# Patient Record
Sex: Male | Born: 1975 | Hispanic: Yes | Marital: Married | Smoking: Never smoker
Health system: Southern US, Community
[De-identification: ages and names within clinical notes are randomized; demographics above are authoritative.]

---

## 2005-08-06 ENCOUNTER — Emergency Department (HOSPITAL_COMMUNITY): Admission: EM | Admit: 2005-08-06 | Discharge: 2005-08-06 | Payer: Self-pay | Admitting: *Deleted

## 2007-04-14 ENCOUNTER — Ambulatory Visit (HOSPITAL_COMMUNITY): Admission: RE | Admit: 2007-04-14 | Discharge: 2007-04-14 | Payer: Self-pay | Admitting: Chiropractic Medicine

## 2008-11-28 ENCOUNTER — Emergency Department (HOSPITAL_COMMUNITY): Admission: EM | Admit: 2008-11-28 | Discharge: 2008-11-28 | Payer: Self-pay | Admitting: Emergency Medicine

## 2010-11-29 ENCOUNTER — Encounter: Payer: Self-pay | Admitting: Chiropractic Medicine

## 2015-03-21 ENCOUNTER — Emergency Department (HOSPITAL_COMMUNITY)
Admission: EM | Admit: 2015-03-21 | Discharge: 2015-03-22 | Disposition: A | Discharge status: Home / Self Care | Payer: Self-pay | Attending: Emergency Medicine | Admitting: Emergency Medicine

## 2015-03-21 ENCOUNTER — Emergency Department (HOSPITAL_COMMUNITY): Payer: Self-pay

## 2015-03-21 ENCOUNTER — Encounter (HOSPITAL_COMMUNITY): Payer: Self-pay | Admitting: *Deleted

## 2015-03-21 DIAGNOSIS — Z23 Encounter for immunization: Secondary | ICD-10-CM | POA: Insufficient documentation

## 2015-03-21 DIAGNOSIS — Y998 Other external cause status: Secondary | ICD-10-CM | POA: Insufficient documentation

## 2015-03-21 DIAGNOSIS — S4991XA Unspecified injury of right shoulder and upper arm, initial encounter: Secondary | ICD-10-CM | POA: Insufficient documentation

## 2015-03-21 DIAGNOSIS — S62609A Fracture of unspecified phalanx of unspecified finger, initial encounter for closed fracture: Secondary | ICD-10-CM

## 2015-03-21 DIAGNOSIS — Y9289 Other specified places as the place of occurrence of the external cause: Secondary | ICD-10-CM | POA: Insufficient documentation

## 2015-03-21 DIAGNOSIS — S62623A Displaced fracture of medial phalanx of left middle finger, initial encounter for closed fracture: Secondary | ICD-10-CM | POA: Insufficient documentation

## 2015-03-21 DIAGNOSIS — Y9389 Activity, other specified: Secondary | ICD-10-CM | POA: Insufficient documentation
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
## 2015-03-22 ENCOUNTER — Encounter (HOSPITAL_COMMUNITY): Payer: Self-pay | Admitting: Emergency Medicine

## 2015-03-22 ENCOUNTER — Emergency Department (HOSPITAL_COMMUNITY): Payer: Self-pay

## 2015-03-22 MED ORDER — KETOROLAC TROMETHAMINE 60 MG/2ML IM SOLN
60.0000 mg | Freq: Once | INTRAMUSCULAR | Status: AC
  Administered 2015-03-22: 60 mg via INTRAMUSCULAR
  Filled 2015-03-22: qty 2

## 2015-03-22 MED ORDER — TETANUS-DIPHTH-ACELL PERTUSSIS 5-2.5-18.5 LF-MCG/0.5 IM SUSP
0.5000 mL | Freq: Once | INTRAMUSCULAR | Status: AC
  Administered 2015-03-22: 0.5 mL via INTRAMUSCULAR
  Filled 2015-03-22: qty 0.5

## 2015-03-22 MED ORDER — NAPROXEN 375 MG PO TABS: 375.0000 mg | ORAL_TABLET | Freq: Two times a day (BID) | ORAL | Status: DC
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
## 2016-03-17 ENCOUNTER — Ambulatory Visit (INDEPENDENT_AMBULATORY_CARE_PROVIDER_SITE_OTHER): Payer: Self-pay | Admitting: Family Medicine

## 2016-03-17 VITALS — BP 124/86 | HR 68 | Temp 98.6°F | Resp 16 | Wt 186.0 lb

## 2016-03-17 DIAGNOSIS — L409 Psoriasis, unspecified: Secondary | ICD-10-CM

## 2016-03-17 MED ORDER — CLOBETASOL PROPIONATE 0.05 % EX CREA: 1.0000 "application " | TOPICAL_CREAM | Freq: Two times a day (BID) | CUTANEOUS | Status: DC

## 2016-03-17 NOTE — Patient Instructions (Addendum)
Psoriasis (Psoriasis) La psoriasis es una afeccin prolongada (crnica) de inflamacin en la piel. Se produce debido a que el sistema inmunitario hace que se formen clulas cutneas con mucha rapidez. En consecuencia, se desarrollan demasiadas clulas cutneas que forman manchas (placas) rojas y con relieve en la piel, que se ven plateadas. Las placas pueden aparecer en cualquier parte del cuerpo. Pueden tener cualquier tamao o forma. La psoriasis puede ir y venir. Puede ser de leve a muy grave. No puede transmitirse de [place] persona a otra (no es contagiosa).  CAUSAS  Se desconoce la causa de la psoriasis, pero determinados factores pueden empeorar la afeccin. Estos incluyen los siguientes:   Lesiones o traumatismos en la piel, como cortes, raspones, quemaduras de sol y sequedad.  Falta de sol.  Algunos medicamentos.  Alcohol.  Consumo de tabaco.  El estrs.  Infecciones causadas por bacterias o virus. FACTORES DE RIESGO Es ms probable que esta afeccin se manifieste en:  Las personas con antecedentes familiares de psoriasis.  Las Dealerpersonas de origen caucsico.  Las personas que tienen de 15 a 30aos y de 50 a 60aos. SNTOMAS  Existen cinco tipos diferentes de psoriasis. Se puede presentar ms de un tipo de psoriasis a lo largo de la vida. Estos tipos son los siguientes:   En placas.  En gotas.  Seborreica.  Pustulosa.  Eritrodrmica. Cada tipo de psoriasis presenta sntomas diferentes.   Uno de los sntomas de la psoriasis en placas es la presencia de Wilburtonmachas rojas y con relieve, con una cubierta blanca plateada (escama). Estas placas pueden causar picazn. Las uas pueden estar frgiles y Panamaquebradizas, o caerse.  Uno de los sntomas de la psoriasis en gotas es la presencia de manchas rojas pequeas que a menudo aparecen en el tronco, los brazos y las piernas. Estas manchas pueden desarrollarse despus de una enfermedad, en especial, faringitis estreptoccica.  Uno  de los sntomas de la psoriasis seborreica es la presencia de placas debajo de las axilas o los senos, o en los genitales, la ingle o las nalgas.  Uno de los sntomas de la psoriasis pustulosa es la presencia de los bultos llenos de pus (dolorosos, rojos e hinchados) en las palmas de las manos o las plantas de los pies. Tambin puede sentirse exhausto, afiebrado, dbil o sin apetito.  Uno de los sntomas de la psoriasis eritrodrmica es el aspecto rojo brillante en la piel, que puede parecer Cave Springsquemada. Puede sentir los latidos cardacos acelerados y Press photographertener la temperatura corporal muy alta o muy baja. Quizs sienta picazn o dolor. DIAGNSTICO  El mdico puede sospechar la presencia de psoriasis en funcin de los sntomas y los antecedentes familiares. El mdico Location managertambin le realizar un examen fsico. Esto puede incluir un procedimiento para extraer y examinar Lauris Poaguna muestra de tejido (biopsia). Tambin pueden derivarlo a un mdico especialista en enfermedades de la piel (dermatlogo).  TRATAMIENTO Esta afeccin no tiene [place]ub[place], pero el tratamiento puede ayudar a Theatre managercontrolarla. Entre los PepsiCoobjetivos del Salladasburgtratamiento, se incluye lo siguiente:   Ayudar a que la piel cicatrice.  Reducir la picazn y la inflamacin.  Retrasar el desarrollo de nuevas clulas cutneas.  Ayudar a que el sistema inmunitario responda mejor y no deteriore la piel. El tratamiento vara, segn la gravedad de la afeccin. El tratamiento puede incluir lo siguiente:   Cremas o ungentos.  Exposicin a rayos ultravioleta (fototerapia). Esto puede incluir la luz natural del sol o la fototerapia en el consultorio mdico.  Medicamentos (tratamiento sistmico). Estos medicamentos pueden ayudar  a que el cuerpo controle mejor el desarrollo de las clulas cutneas y la inflamacin. Pueden utilizarse junto con fototerapia o ungentos. Si tiene una infeccin, tambin puede recibir antibiticos. INSTRUCCIONES PARA EL CUIDADO EN EL HOGAR Cuidado de la  piel  Humctese la piel segn sea necesario. Use solamente las cremas humectantes aprobadas por su mdico.   Aplique compresas fras en las zonas afectadas.   No se rasque la piel.  Estilo de vida  No consuma productos que contengan tabaco. Estos incluyen cigarrillos, tabaco para mascar y cigarrillos electrnicos. Si necesita ayuda para dejar de fumar, consulte al mdico.  No tome alcohol, o tome poca cantidad.   Pruebe con tcnicas que reduzcan el estrs, como meditacin o yoga.  Expngase al sol como se lo haya indicado el mdico. No se broncee.   Considere participar en un grupo de apoyo para la psoriasis.  Medicamentos  Tome o use los medicamentos de venta libre y los recetados solamente como se lo haya indicado el mdico.  Si le recetaron un antibitico, tmelo o selo como se lo haya indicado el mdico. No deje de tomar el antibitico aunque la afeccin mejore. Instrucciones generales  Lleve un diario como ayuda para rastrear lo que le desencadena un brote. Intente evitar los factores desencadenantes.   Consulte a un consejero o un asistente social si se siente triste, frustrado o desesperanzado con respecto a su afeccin ya que considera que esta interfiere en su trabajo y sus relaciones.  Concurra a todas las visitas de control como se lo haya indicado el mdico. Esto es importante. SOLICITE ATENCIN MDICA SI:  El dolor empeora.  Aumentan el enrojecimiento y el calor en la zona de la herida.   Comienza a tener dolor o entumecimiento en las articulaciones, o estos sntomas empeoran.  Las uas comienzan a romperse fcilmente o a desprenderse del lecho ungueal.   Tiene fiebre.   Se siente deprimido.   Esta informacin no tiene como fin reemplazar el consejo del mdico. Asegrese de hacerle al mdico cualquier pregunta que tenga.   Document Released: 08/04/2005 Document Revised: 07/16/2015 Elsevier Interactive Patient Education 2016 Elsevier  Inc.  

## 2016-03-17 NOTE — Progress Notes (Signed)
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 It does run in his family. The main areas of psoriasis on his anterior shins where he's chafed it on the ladder.  Patient has used triamcinolone in the past but has not worked.  Objective::BP 124/86 mmHg  Pulse 68  Temp(Src) 98.6 F (37 C) (Oral)  Resp 16  Wt 186 lb (84.369 kg)  SpO2 96% Patient has silver plaque overlying abrasions on his anterior shins.  Assessment: Psoriasis, chronic and limited to his lower extremities  Plan:Psoriasis - Plan: clobetasol cream (TEMOVATE) 0.05 %  Signed, Elvina SidleKurt Talea Manges M.D.

## 2017-03-31 ENCOUNTER — Ambulatory Visit (INDEPENDENT_AMBULATORY_CARE_PROVIDER_SITE_OTHER): Payer: Self-pay | Admitting: Emergency Medicine

## 2017-03-31 ENCOUNTER — Encounter: Payer: Self-pay | Admitting: Emergency Medicine

## 2017-03-31 VITALS — BP 117/78 | HR 58 | Temp 98.0°F | Resp 18 | Ht 65.0 in | Wt 181.4 lb

## 2017-03-31 DIAGNOSIS — S0502XA Injury of conjunctiva and corneal abrasion without foreign body, left eye, initial encounter: Secondary | ICD-10-CM | POA: Insufficient documentation

## 2017-03-31 MED ORDER — TOBRAMYCIN 0.3 % OP SOLN: 2.0000 [drp] | OPHTHALMIC | 1 refills | Status: DC
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
## 2017-03-31 NOTE — Patient Instructions (Addendum)
     IF you received an x-ray today, you will [facility] an [facility]. Please contact [facility] at [phone] with questions or concerns regarding your invoice.   IF you received labwork today, you will receive an invoice from [facility]. Please contact [facility] at [phone] with questions or concerns regarding your invoice.   Our billing staff will not be able to assist you with questions regarding bills from these companies.  You will be contacted with the lab results as soon as they are available. The fastest way to get your results is to activate your My Chart account. Instructions are located on the last page of this paperwork. If you have not heard from us regarding the results in 2 weeks, please contact this office.     Abrasin corneal (Corneal Abrasion) La crnea es la cubierta transparente en la parte anterior y central del ojo. Cuando mira la parte colorida del ojo, est mirando a travs de la crnea. Es un tejido delgado formado por capas. La capa superior es la capa ms sensible. La abrasin corneal ocurre cuando esta capa sufre un rasguo o una lesin hace que se desprenda. CUIDADOS EN EL HOGAR  Pueden administrarle gotas o una crema con medicamento. Tome los Monsanto Companymedicamentos como le indique su mdico.  Es posible que le apliquen un parche de presin sobre el ojo. Si este es [address] caso, siga las instrucciones de su mdico respecto de cundo Corporate treasurerretirar el parche. No conduzca ni opere maquinaria mientras lleve puesto el parche. Es difcil juzgar las Sales promotion account executivedistancias en estas condiciones.  Visite a su mdico para que le realice un examen de seguimiento, si as se lo indican. Es muy importante que cumpla con esta cita. SOLICITE AYUDA SI:  Electronics engineeriente dolor, tiene sensibilidad a la luz y tiene una sensacin de picazn en un ojo o en ambos.  Su parche de presin se afloja continuamente. Puede parpadear debajo del parche.  Supura lquido del ojo o los prpados se  pegan por la [place]maana.  Siente por la American Family Insurancemaana los mismos sntomas que sinti con la primera abrasin. Esto podra Owens Corningocurrir das, [address]emanas o meses despus de que se cur de la primera abrasin. Esta informacin no tiene Theme park managercomo fin reemplazar el consejo del mdico. Asegrese de hacerle al mdico cualquier pregunta que tenga. Document Released: 11/27/2010 Document Revised: 07/16/2015 Document Reviewed: 07/02/2013 Elsevier Interactive Patient Education  2017 [employer]vier Inc.

## 2019-01-02 ENCOUNTER — Ambulatory Visit: Payer: Self-pay | Admitting: Family Medicine

## 2019-01-02 ENCOUNTER — Encounter: Payer: Self-pay | Admitting: Family Medicine

## 2019-01-02 VITALS — BP 144/100 | HR 67 | Temp 98.6°F | Resp 17 | Ht 65.0 in | Wt 198.0 lb

## 2019-01-02 DIAGNOSIS — Z23 Encounter for immunization: Secondary | ICD-10-CM

## 2019-01-02 DIAGNOSIS — L80 Vitiligo: Secondary | ICD-10-CM

## 2019-01-02 DIAGNOSIS — L409 Psoriasis, unspecified: Secondary | ICD-10-CM

## 2019-01-02 MED ORDER — TACROLIMUS 0.1 % EX OINT: TOPICAL_OINTMENT | Freq: Two times a day (BID) | CUTANEOUS | 0 refills | Status: DC

## 2019-01-02 NOTE — Progress Notes (Signed)
Established Patient Office Visit  Subjective:  Patient ID: Robert Townsend, male    DOB: Feb 20, 1976  Age: 43 y.o. MRN: [id number]  CC:  Chief Complaint  Patient presents with  . Abrasion    X 5 mth due to a fall- pt states abrasion is getting bigger    HPI Robert Townsend presents for   Pt reports that he fell and injured his left forehead and had a scratch Over the next few months little by little the spot of light colored skin got bigger  He states that is has now covered an area from the left corner of his eyebrow to the middle of his forehead He states that he also has patches of scales on his left leg shin and his right forearm  No past medical history on file.  No past surgical history on file.  No family history on file.  Social History   Socioeconomic History  . Marital status: Married    Spouse name: Not on file  . Number of children: Not on file  . Years of education: Not on file  . Highest education level: Not on file  Occupational History  . Not on file  Social Needs  . Financial resource strain: Not on file  . Food insecurity:    Worry: Not on file    Inability: Not on file  . Transportation needs:    Medical: Not on file    Non-medical: Not on file  Tobacco Use  . Smoking status: Never Smoker  . Smokeless tobacco: Never Used  Substance and Sexual Activity  . Alcohol use: Not on file  . Drug use: Not on file  . Sexual activity: Not on file  Lifestyle  . Physical activity:    Days per week: Not on file    Minutes per session: Not on file  . Stress: Not on file  Relationships  . Social connections:    Talks on phone: Not on file    Gets together: Not on file    Attends religious service: Not on file    Active member of club or organization: Not on file    Attends meetings of clubs or organizations: Not on file    Relationship status: Not on file  . Intimate partner violence:    Fear of current or ex partner: Not on file      Emotionally abused: Not on file    Physically abused: Not on file    Forced sexual activity: Not on file  Other Topics Concern  . Not on file  Social History Narrative  . Not on file    Outpatient Medications Prior to Visit  Medication Sig Dispense Refill  . tobramycin (TOBREX) 0.3 % ophthalmic solution Place 2 drops into the left eye every 4 (four) hours. 5 mL 1   No facility-administered medications prior to visit.     No Known Allergies  ROS Review of Systems    Objective:    Physical Exam  BP (!) 144/100 (BP Location: Right Arm, Cuff Size: Normal)   Pulse 67   Temp 98.6 F (37 C) (Oral)   Resp 17   Ht 5\' 5"  (1.651 m)   Wt 198 lb (89.8 kg)   SpO2 98%   BMI 32.95 kg/m  Wt Readings from Last 3 Encounters:  01/02/19 198 lb (89.8 kg)  03/31/17 181 lb 6.4 oz (82.3 kg)  03/17/16 186 lb (84.4 kg)        Health Maintenance Due  Topic Date Due  . HIV Screening  09/16/1991    There are no preventive care reminders to display for this patient.     Assessment & Plan:   Problem List Items Addressed This Visit      Musculoskeletal and Integument   Psoriasis   Relevant Medications   tacrolimus (PROTOPIC) 0.1 % ointment    Other Visit Diagnoses    Vitiligo    -  Primary   Relevant Medications   tacrolimus (PROTOPIC) 0.1 % ointment   Need for prophylactic vaccination and inoculation against influenza       Relevant Orders   Flu Vaccine QUAD 36+ mos IM (Completed)     Will treat with tacrolimus which can help psoriasis and vitiligo   Meds ordered this encounter  Medications  . tacrolimus (PROTOPIC) 0.1 % ointment    Sig: Apply topically 2 (two) times daily.    Dispense:  30 g    Refill:  0    Follow-up: Return in about 2 months (around 03/03/2019) for psoriasis and vitiligo.    Doristine Bosworth, MD

## 2019-01-02 NOTE — Patient Instructions (Addendum)
If you have lab work done today you will be contacted with your lab results within the next 2 weeks.  If you have not heard from [place] then please contact us. The fastest way to get your results is to register for My Chart.   IF you received an x-ray today, you will receive an invoice from [facility]. Please contact [facility] at [phone] with questions or concerns regarding your invoice.   IF you received labwork today, you will receive an invoice from [facility]. Please contact [facility] at [phone] with questions or concerns regarding your invoice.   Our billing staff will not be able to assist you with questions regarding bills from these companies.  You will be contacted with the lab results as soon as they are available. The fastest way to get your results is to activate your My Chart account. Instructions are located on the last page of this paperwork. If you have not heard from [place] regarding the results in 2 weeks, please contact this office.      Psoriasis (Psoriasis) La psoriasis es una afeccin prolongada (crnica) de inflamacin en la piel. Se produce debido a que el sistema inmunitario hace que se formen clulas cutneas con mucha rapidez. En consecuencia, se desarrollan demasiadas clulas cutneas que forman manchas (placas) rojas y con relieve en la piel, que se ven plateadas. Las placas pueden aparecer en cualquier parte del cuerpo. Pueden tener cualquier tamao o forma. La psoriasis puede ir y venir. Puede ser de leve a muy grave. No puede transmitirse de [place] persona a otra (no es contagiosa). CAUSAS Se desconoce la causa de la psoriasis, pero determinados factores pueden empeorar la afeccin. Estos incluyen los siguientes:  Lesiones o traumatismos en la piel, como cortes, raspones, quemaduras de sol y sequedad.  Falta de sol.  Algunos medicamentos.  Alcohol.  Consumo de tabaco.  El estrs.  Infecciones causadas por bacterias o  virus. FACTORES DE RIESGO Es ms probable que esta afeccin se manifieste en:  Las personas con antecedentes familiares de psoriasis.  Las Dealer de origen caucsico.  Las personas que tienen de 15 a 30aos y de 50 a 60aos. SNTOMAS Existen cinco tipos diferentes de psoriasis. Se puede presentar ms de un tipo de psoriasis a lo largo de la vida. Estos tipos son los siguientes:  En placas.  En gotas.  Seborreica.  Pustulosa.  Eritrodrmica. Cada tipo de psoriasis presenta sntomas diferentes.  Uno de los sntomas de la psoriasis en placas es la presencia de Geraldine rojas y con relieve, con una cubierta blanca plateada (escama). Estas placas pueden causar picazn. Las uas pueden estar frgiles y [place], o caerse.  Uno de los sntomas de la psoriasis en gotas es la presencia de manchas rojas pequeas que a menudo aparecen en el tronco, los brazos y las piernas. Estas manchas pueden desarrollarse despus de una enfermedad, en especial, faringitis estreptoccica.  Uno de los sntomas de la psoriasis seborreica es la presencia de placas debajo de las axilas o los senos, o en los genitales, la ingle o las nalgas.  Uno de los sntomas de la psoriasis pustulosa es la presencia de los bultos llenos de pus (dolorosos, rojos e hinchados) en las palmas de las manos o las plantas de los pies. Tambin puede sentirse exhausto, afiebrado, dbil o sin apetito.  Uno de los sntomas de la psoriasis eritrodrmica es el aspecto rojo brillante en la piel, que puede parecer [place]. Puede sentir los latidos cardacos acelerados y  tener la temperatura corporal muy alta o muy baja. Quizs sienta picazn o dolor. DIAGNSTICO El mdico puede sospechar la presencia de psoriasis en funcin de los sntomas y los antecedentes familiares. El mdico Location manager un examen fsico. Esto puede incluir un procedimiento para extraer y examinar Lauris Poag de tejido (biopsia). Tambin pueden derivarlo a  un mdico especialista en enfermedades de la piel (dermatlogo). TRATAMIENTO Esta afeccin no tiene [place], pero el tratamiento puede ayudar a Theatre manager. Entre los [employer] del [place], se incluye lo siguiente:  Ayudar a que la piel cicatrice.  Reducir la picazn y la inflamacin.  Retrasar el desarrollo de nuevas clulas cutneas.  Ayudar a que el sistema inmunitario responda mejor y no deteriore la piel. El tratamiento vara, segn la gravedad de la afeccin. El tratamiento puede incluir lo siguiente:  Cremas o ungentos.  Exposicin a rayos ultravioleta (fototerapia). Esto puede incluir la luz natural del sol o la fototerapia en el consultorio mdico.  Medicamentos (tratamiento sistmico). Estos medicamentos pueden ayudar a que el cuerpo controle mejor el desarrollo de las clulas cutneas y la inflamacin. Pueden utilizarse junto con fototerapia o ungentos. Si tiene una infeccin, tambin puede recibir antibiticos. INSTRUCCIONES PARA EL CUIDADO EN EL HOGAR Cuidado de la piel  Humctese la piel segn sea necesario. Use solamente las cremas humectantes aprobadas por su mdico.  Aplique compresas fras en las zonas afectadas.  No se rasque la piel. Estilo de vida  No consuma productos que contengan tabaco. Estos incluyen cigarrillos, tabaco para mascar y Administrator, Civil Service. Si necesita ayuda para dejar de fumar, consulte al mdico.  No tome alcohol, o tome poca cantidad.  Pruebe con tcnicas que reduzcan el estrs, como meditacin o yoga.  Expngase al sol como se lo haya indicado el mdico. No se broncee.  Considere participar en un grupo de apoyo para la psoriasis. Medicamentos  Tome o use los medicamentos de venta libre y los recetados solamente como se lo haya indicado el mdico.  Si le recetaron un antibitico, tmelo o selo como se lo haya indicado el mdico. No deje de tomar el antibitico aunque la afeccin mejore. Instrucciones generales  Lleve un  diario como ayuda para rastrear lo que le desencadena un brote. Intente evitar los factores desencadenantes.  Consulte a un consejero o un asistente social si se siente triste, frustrado o desesperanzado con respecto a su afeccin ya que considera que esta interfiere en su trabajo y sus relaciones.  Concurra a todas las visitas de control como se lo haya indicado el mdico. Esto es importante. SOLICITE ATENCIN MDICA SI:  El dolor empeora.  Aumentan el enrojecimiento y Company secretary en la zona de la herida.  Comienza a Surveyor, mining o entumecimiento en las articulaciones, o estos sntomas empeoran.  Las uas comienzan a romperse fcilmente o a desprenderse del lecho ungueal.  Tiene fiebre.  Se siente deprimido. Esta informacin no tiene Theme park manager el consejo del mdico. Asegrese de hacerle al mdico cualquier pregunta que tenga. Document Released: 08/04/2005 Document Revised: 07/16/2015 Document Reviewed: 03/12/2015 Elsevier Interactive Patient Education  2019 [employer].

## 2019-03-06 ENCOUNTER — Ambulatory Visit: Payer: Self-pay | Admitting: Family Medicine

## 2019-05-08 ENCOUNTER — Ambulatory Visit: Payer: Self-pay | Admitting: Family Medicine

## 2019-05-23 ENCOUNTER — Ambulatory Visit: Payer: Self-pay | Admitting: Family Medicine

## 2019-05-23 ENCOUNTER — Other Ambulatory Visit: Payer: Self-pay

## 2019-05-23 ENCOUNTER — Encounter: Payer: Self-pay | Admitting: Family Medicine

## 2019-05-23 DIAGNOSIS — L80 Vitiligo: Secondary | ICD-10-CM

## 2019-05-23 DIAGNOSIS — L409 Psoriasis, unspecified: Secondary | ICD-10-CM

## 2019-05-23 MED ORDER — TACROLIMUS 0.1 % EX OINT: TOPICAL_OINTMENT | Freq: Two times a day (BID) | CUTANEOUS | 11 refills | Status: DC

## 2019-05-23 MED ORDER — DOXYCYCLINE HYCLATE 100 MG PO TABS: 100.0000 mg | ORAL_TABLET | Freq: Two times a day (BID) | ORAL | 0 refills | Status: DC

## 2019-05-23 NOTE — Progress Notes (Signed)
Established Patient Office Visit  Subjective:  Patient ID: Robert Townsend, male    DOB: 05/03/1976  Age: 43 y.o. MRN: [id number]  CC:  Chief Complaint  Patient presents with  . Rash    both legs 7 months    HPI Robert Townsend presents for   Follow up on skin lesions on legs Pt reports that he has same skin lesions on his legs that are crusty silver patches He works as an Personnel officerelectrician The areas are itchy The improved with tacrolimus but did not have a refill No fevers or c hills   History reviewed. No pertinent past medical history.  History reviewed. No pertinent surgical history.  History reviewed. No pertinent family history.  Social History   Socioeconomic History  . Marital status: Married    Spouse name: Not on file  . Number of children: Not on file  . Years of education: Not on file  . Highest education level: Not on file  Occupational History  . Not on file  Social Needs  . Financial resource strain: Not on file  . Food insecurity    Worry: Not on file    Inability: Not on file  . Transportation needs    Medical: Not on file    Non-medical: Not on file  Tobacco Use  . Smoking status: Never Smoker  . Smokeless tobacco: Never Used  Substance and Sexual Activity  . Alcohol use: Not on file  . Drug use: Not on file  . Sexual activity: Not on file  Lifestyle  . Physical activity    Days per week: Not on file    Minutes per session: Not on file  . Stress: Not on file  Relationships  . Social Musicianconnections    Talks on phone: Not on file    Gets together: Not on file    Attends religious service: Not on file    Active member of club or organization: Not on file    Attends meetings of clubs or organizations: Not on file    Relationship status: Not on file  . Intimate partner violence    Fear of current or ex partner: Not on file    Emotionally abused: Not on file    Physically abused: Not on file    Forced sexual activity: Not  on file  Other Topics Concern  . Not on file  Social History Narrative  . Not on file    Outpatient Medications Prior to Visit  Medication Sig Dispense Refill  . tacrolimus (PROTOPIC) 0.1 % ointment Apply topically 2 (two) times daily. (Patient not taking: Reported on 05/23/2019) 30 g 0   No facility-administered medications prior to visit.     No Known Allergies  ROS Review of Systems    Objective:    Physical Exam  BP 122/72 (BP Location: Left Arm, Patient Position: Sitting, Cuff Size: Normal)   Pulse 71   Temp 98.3 F (36.8 C) (Oral)   Resp 16   Ht 5' 5.5" (1.664 m)   Wt 195 lb 9.6 oz (88.7 kg)   SpO2 97%   BMI 32.05 kg/m  Wt Readings from Last 3 Encounters:  05/23/19 195 lb 9.6 oz (88.7 kg)  01/02/19 198 lb (89.8 kg)  03/31/17 181 lb 6.4 oz (82.3 kg)   Physical Exam  Constitutional: Oriented to person, place, and time. Appears well-developed and well-nourished.  HENT:  Head: Normocephalic and atraumatic.  Eyes: Conjunctivae and EOM are normal.  Cardiovascular: Normal rate, regular  rhythm, normal heart sounds and intact distal pulses.  No murmur heard. Pulmonary/Chest: Effort normal and breath sounds normal. No stridor. No respiratory distress. Has no wheezes.  Neurological: Is alert and oriented to person, place, and time.  Skin: hypopigmentation on forehead smaller Psychiatric: Has a normal mood and affect. Behavior is normal. Judgment and thought content normal.   Erythematous lesions on the legs bilaterally Well circumscribed circular lesions Silver patches Without fluctuance  Health Maintenance Due  Topic Date Due  . HIV Screening  09/16/1991    There are no preventive care reminders to display for this patient.     Assessment & Plan:   Problem List Items Addressed This Visit      Musculoskeletal and Integument   Psoriasis  - will treat local infection with doxycycline Resume tacrolimus   Relevant Medications   tacrolimus (PROTOPIC) 0.1  % ointment   doxycycline (VIBRA-TABS) 100 MG tablet    Other Visit Diagnoses    Vitiligo    - improved       Meds ordered this encounter  Medications  . tacrolimus (PROTOPIC) 0.1 % ointment    Sig: Apply topically 2 (two) times daily.    Dispense:  30 g    Refill:  11  . doxycycline (VIBRA-TABS) 100 MG tablet    Sig: Take 1 tablet (100 mg total) by mouth 2 (two) times daily. For skin infection    Dispense:  20 tablet    Refill:  0    Follow-up: Return if symptoms worsen or fail to improve.    Forrest Moron, MD

## 2019-05-23 NOTE — Patient Instructions (Signed)
Psoriasis Psoriasis is a long-term (chronic) skin condition. It occurs because your immune system causes skin cells to form too quickly. As a result, too many skin cells grow and create raised, red patches (plaques) that often look silvery on your skin. Plaques may show up anywhere on your body. They can be any size or shape. Symptoms of this condition range from mild to very severe. Psoriasis cannot be passed from one person to another (is not contagious). Sometimes, the symptoms go away and then come back again. What are the causes? The cause of psoriasis is not known, but certain factors can make the condition worse. These include:  Damage or trauma to the skin, such as cuts, scrapes, sunburn, and dryness.  Not enough exposure to sunlight.  Certain medicines.  Alcohol.  Tobacco use.  Stress.  Infections caused by bacteria or viruses. What increases the risk? You are more likely to develop this condition if you:  Have a family history of psoriasis.  Are obese.  Are 20-40 years old.  Are taking certain medicines. What are the signs or symptoms? There are different types of psoriasis. You can have more than one type of psoriasis during your life. The types are:  Plaque. This is the most common.  Guttate. This is also called eruptive psoriasis.  Inverse.  Pustular.  Erythrodermic.  Sebopsoriasis.  Psoriatic arthritis. Each type of psoriasis has different symptoms.  Plaque psoriasis symptoms include red, raised plaques with a silvery-white coating (scale). These plaques may be itchy. Your nails may be pitted and crumbly or fall off.  Guttate psoriasis symptoms include small red spots that often show up on your trunk, arms, and legs. These spots may develop after you have been sick, especially with strep throat.  Inverse psoriasis symptoms include plaques in your underarm area, under your breasts, or on your genitals, groin, or buttocks.  Pustular psoriasis symptoms  include pus-filled bumps that are painful, red, and swollen on the palms of your hands or the soles of your feet. You also may feel exhausted, feverish, weak, or have no appetite.  Erythrodermic psoriasis symptoms include bright red skin that may look burned. You may have a fast heartbeat and a body temperature that is too high or too low. You may be itchy or in pain.  Sebopsoriasis symptoms include red plaques that have a greasy coating, and are often on your scalp, forehead, and face.  Psoriatic arthritis causes swollen, painful joints along with scaly skin plaques. How is this diagnosed? This condition is diagnosed based on your symptoms, family history, and a physical exam.  You may also be referred to a health care provider who specializes in skin diseases (dermatologist).  Your health care provider may remove a tissue sample (biopsy) for testing. How is this treated? There is no cure for this condition, but treatment can help manage it. Goals of treatment include:  Helping your skin heal.  Reducing itching and inflammation.  Slowing the growth of new skin cells.  Helping your immune system respond better to your skin. Treatment varies, depending on the severity of your condition. This condition may be treated by:  Creams or ointments to help with symptoms.  Ultraviolet ray exposure (light therapy or phototherapy). This may include natural sunlight or light therapy in a medical office.  Medicines (systemic therapy). These medicines can help your body better manage skin cell turnover and inflammation. Medicines may be given in the form of pills or injections. They may be used along with light   therapy or ointments. You may also get antibiotic medicines if you have an infection. Follow these instructions at home: Skin Care  Moisturize your skin as needed. Only use moisturizers that have been approved by your health care provider.  Apply cool, wet cloths (cold compresses) to the  affected areas.  Do not use a hot tub or take hot showers. Take lukewarm showers and baths.  Do not scratch your skin. Lifestyle   Do not use any products that contain nicotine or tobacco, such as cigarettes, e-cigarettes, and chewing tobacco. If you need help quitting, ask your health care provider.  Use techniques for stress reduction, such as meditation or yoga.  Maintain a healthy weight. Follow instructions from your health care provider for weight control. These may include dietary restrictions.  Get safe exposure to the sun as told by your health care provider. This may include spending regular intervals of time outdoors in sunlight. Do not get sunburned.  Consider joining a psoriasis support group. Medicines  Take or use over-the-counter and prescription medicines only as told by your health care provider.  If you were prescribed an antibiotic medicine, take it as told by your health care provider. Do not stop using the antibiotic even if you start to feel better. Alcohol use  Limit how much you use: ? 0-1 drink a day for women. ? 0-2 drinks a day for men.  Be aware of how much alcohol is in your drink. In the [place] one drink equals one 12 oz bottle of beer (355 mL), one 5 oz glass of wine (148 mL), or one 1 oz glass of hard liquor (44 mL). General instructions  Keep a journal to help track what triggers an outbreak. Try to avoid any triggers.  See a counselor if feelings of sadness, frustration, and hopelessness about your condition are interfering with your work and relationships.  Keep all follow-up visits as told by your health care provider. This is important. Contact a health care provider if:  You have a fever.  Your pain gets worse.  You have increasing redness or warmth in the affected areas.  You have new or worsening pain or stiffness in your joints.  Your nails start to break easily or pull away from the nail bed.  You feel depressed. Summary   Psoriasis is a long-term (chronic) skin condition. Patches (plaques) may show up anywhere on your body.  There is no cure for this condition, but treatment can help manage it. Treatment varies, depending on the severity of your condition.  Keep a journal to track what triggers an outbreak. Try to avoid any triggers.  Take or use over-the-counter and prescription medicines only as told by your health care provider.  Keep all follow-up visits as told by your health care provider. This is important. This information is not intended to replace advice given to you by your health care provider. Make sure you discuss any questions you have with your health care provider. Document Released: 10/22/2000 Document Revised: 08/29/2018 Document Reviewed: 08/29/2018 Elsevier Patient Education  2020 Elsevier Inc.  

## 2021-08-03 ENCOUNTER — Encounter: Payer: Self-pay | Admitting: Emergency Medicine

## 2021-08-03 ENCOUNTER — Other Ambulatory Visit: Payer: Self-pay

## 2021-08-03 ENCOUNTER — Ambulatory Visit
Admission: EM | Admit: 2021-08-03 | Discharge: 2021-08-03 | Disposition: A | Discharge status: Home / Self Care | Payer: Self-pay | Attending: Urgent Care | Admitting: Urgent Care

## 2021-08-03 DIAGNOSIS — L409 Psoriasis, unspecified: Secondary | ICD-10-CM

## 2021-08-03 DIAGNOSIS — L80 Vitiligo: Secondary | ICD-10-CM

## 2021-08-03 MED ORDER — BETAMETHASONE DIPROPIONATE 0.05 % EX OINT: TOPICAL_OINTMENT | Freq: Two times a day (BID) | CUTANEOUS | 0 refills | Status: DC
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
## 2021-08-03 NOTE — Discharge Instructions (Addendum)
Puede usar betamethasone dos veces al dia por una semana. Despues debe dejar de usar la pumada por 1 semana entera antes de comenzarla de nuevo.

## 2021-08-03 NOTE — ED Triage Notes (Signed)
Bilateral feet rash on-going x 3 years. Was given a cream for it last year without improvement

## 2022-10-15 ENCOUNTER — Emergency Department (HOSPITAL_COMMUNITY): Payer: No Typology Code available for payment source

## 2022-10-15 ENCOUNTER — Other Ambulatory Visit: Payer: Self-pay

## 2022-10-15 ENCOUNTER — Encounter (HOSPITAL_COMMUNITY): Payer: Self-pay

## 2022-10-15 ENCOUNTER — Emergency Department (HOSPITAL_COMMUNITY)
Admission: EM | Admit: 2022-10-15 | Discharge: 2022-10-16 | Disposition: A | Discharge status: Home / Self Care | Payer: No Typology Code available for payment source | Attending: Emergency Medicine | Admitting: Emergency Medicine

## 2022-10-15 DIAGNOSIS — Y92410 Unspecified street and highway as the place of occurrence of the external cause: Secondary | ICD-10-CM | POA: Diagnosis not present

## 2022-10-15 DIAGNOSIS — Z79899 Other long term (current) drug therapy: Secondary | ICD-10-CM | POA: Diagnosis not present

## 2022-10-15 DIAGNOSIS — S01512A Laceration without foreign body of oral cavity, initial encounter: Secondary | ICD-10-CM | POA: Diagnosis not present

## 2022-10-15 DIAGNOSIS — Z23 Encounter for immunization: Secondary | ICD-10-CM | POA: Insufficient documentation

## 2022-10-15 DIAGNOSIS — S01511A Laceration without foreign body of lip, initial encounter: Secondary | ICD-10-CM | POA: Insufficient documentation

## 2022-10-15 DIAGNOSIS — Z1152 Encounter for screening for COVID-19: Secondary | ICD-10-CM | POA: Diagnosis not present

## 2022-10-15 DIAGNOSIS — F14129 Cocaine abuse with intoxication, unspecified: Secondary | ICD-10-CM | POA: Diagnosis not present

## 2022-10-15 DIAGNOSIS — S00501A Unspecified superficial injury of lip, initial encounter: Secondary | ICD-10-CM | POA: Diagnosis present

## 2022-10-15 DIAGNOSIS — F14929 Cocaine use, unspecified with intoxication, unspecified: Secondary | ICD-10-CM

## 2022-10-15 LAB — CBC
HCT: 45 % (ref 39.0–52.0)
Hemoglobin: 15.8 g/dL (ref 13.0–17.0)
MCH: 31 pg (ref 26.0–34.0)
MCHC: 35.1 g/dL (ref 30.0–36.0)
MCV: 88.2 fL (ref 80.0–100.0)
Platelets: 242 10*3/uL (ref 150–400)
RBC: 5.1 MIL/uL (ref 4.22–5.81)
RDW: 12.4 % (ref 11.5–15.5)
WBC: 9.1 10*3/uL (ref 4.0–10.5)
nRBC: 0 % (ref 0.0–0.2)

## 2022-10-15 LAB — I-STAT CHEM 8, ED
BUN: 7 mg/dL (ref 6–20)
Calcium, Ion: 0.99 mmol/L — ABNORMAL LOW (ref 1.15–1.40)
Chloride: 102 mmol/L (ref 98–111)
Creatinine, Ser: 1.1 mg/dL (ref 0.61–1.24)
Glucose, Bld: 277 mg/dL — ABNORMAL HIGH (ref 70–99)
HCT: 48 % (ref 39.0–52.0)
Hemoglobin: 16.3 g/dL (ref 13.0–17.0)
Potassium: 3.6 mmol/L (ref 3.5–5.1)
Sodium: 136 mmol/L (ref 135–145)
TCO2: 19 mmol/L — ABNORMAL LOW (ref 22–32)

## 2022-10-15 LAB — PROTIME-INR
INR: 1 (ref 0.8–1.2)
Prothrombin Time: 13 seconds (ref 11.4–15.2)

## 2022-10-15 LAB — SAMPLE TO BLOOD BANK

## 2022-10-15 MED ORDER — LORAZEPAM 2 MG/ML IJ SOLN
1.0000 mg | Freq: Once | INTRAMUSCULAR | Status: AC
  Administered 2022-10-15: 1 mg via INTRAVENOUS
  Filled 2022-10-15: qty 1

## 2022-10-15 MED ORDER — TETANUS-DIPHTH-ACELL PERTUSSIS 5-2.5-18.5 LF-MCG/0.5 IM SUSY
0.5000 mL | PREFILLED_SYRINGE | Freq: Once | INTRAMUSCULAR | Status: AC
  Administered 2022-10-15: 0.5 mL via INTRAMUSCULAR
  Filled 2022-10-15: qty 0.5

## 2022-10-15 MED ORDER — LACTATED RINGERS IV BOLUS
1000.0000 mL | Freq: Once | INTRAVENOUS | Status: AC
  Administered 2022-10-15: 1000 mL via INTRAVENOUS

## 2022-10-15 MED ORDER — LIDOCAINE HCL (PF) 1 % IJ SOLN
5.0000 mL | Freq: Once | INTRAMUSCULAR | Status: AC
  Administered 2022-10-15: 5 mL via INTRADERMAL
  Filled 2022-10-15: qty 5

## 2022-10-15 MED ORDER — IOHEXOL 350 MG/ML SOLN
75.0000 mL | Freq: Once | INTRAVENOUS | Status: AC | PRN
  Administered 2022-10-15: 75 mL via INTRAVENOUS

## 2022-10-15 NOTE — ED Notes (Signed)
Patient transported to CT on monitor with TRN
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
## 2022-10-16 LAB — COMPREHENSIVE METABOLIC PANEL
ALT: 36 U/L (ref 0–44)
AST: 39 U/L (ref 15–41)
Albumin: 4.3 g/dL (ref 3.5–5.0)
Alkaline Phosphatase: 86 U/L (ref 38–126)
Anion gap: 15 (ref 5–15)
BUN: 7 mg/dL (ref 6–20)
CO2: 18 mmol/L — ABNORMAL LOW (ref 22–32)
Calcium: 8.8 mg/dL — ABNORMAL LOW (ref 8.9–10.3)
Chloride: 101 mmol/L (ref 98–111)
Creatinine, Ser: 0.99 mg/dL (ref 0.61–1.24)
GFR, Estimated: >60 mL/min (ref >60)
Glucose, Bld: 273 mg/dL — ABNORMAL HIGH (ref 70–99)
Potassium: 3.5 mmol/L (ref 3.5–5.1)
Sodium: 134 mmol/L — ABNORMAL LOW (ref 135–145)
Total Bilirubin: 0.9 mg/dL (ref 0.3–1.2)
Total Protein: 7.8 g/dL (ref 6.5–8.1)

## 2022-10-16 LAB — URINALYSIS, ROUTINE W REFLEX MICROSCOPIC
Bacteria, UA: NONE SEEN
Bilirubin Urine: NEGATIVE
Glucose, UA: >=500 mg/dL — AB
Ketones, ur: NEGATIVE mg/dL
Leukocytes,Ua: NEGATIVE
Nitrite: NEGATIVE
Protein, ur: 30 mg/dL — AB
Specific Gravity, Urine: 1.018 (ref 1.005–1.030)
pH: 5 (ref 5.0–8.0)

## 2022-10-16 LAB — RESP PANEL BY RT-PCR (RSV, FLU A&B, COVID)  RVPGX2
Influenza A by PCR: NEGATIVE
Influenza B by PCR: NEGATIVE
Resp Syncytial Virus by PCR: NEGATIVE
SARS Coronavirus 2 by RT PCR: NEGATIVE

## 2022-10-16 LAB — TROPONIN I (HIGH SENSITIVITY)
Troponin I (High Sensitivity): 13 ng/L (ref <18)
Troponin I (High Sensitivity): 9 ng/L (ref <18)

## 2022-10-16 LAB — RAPID URINE DRUG SCREEN, HOSP PERFORMED
Amphetamines: NOT DETECTED
Barbiturates: NOT DETECTED
Benzodiazepines: NOT DETECTED
Cocaine: POSITIVE — AB
Opiates: NOT DETECTED
Tetrahydrocannabinol: NOT DETECTED

## 2022-10-16 LAB — ETHANOL: Alcohol, Ethyl (B): 151 mg/dL — ABNORMAL HIGH (ref <10)

## 2022-10-16 MED ORDER — CHLORHEXIDINE GLUCONATE 0.12 % MT SOLN: 15.0000 mL | Freq: Two times a day (BID) | OROMUCOSAL | 0 refills | Status: AC

## 2022-10-16 MED ORDER — AMOXICILLIN 250 MG PO CAPS: 250.0000 mg | ORAL_CAPSULE | Freq: Two times a day (BID) | ORAL | 0 refills | Status: DC

## 2022-10-16 MED ORDER — LACTATED RINGERS IV BOLUS
1000.0000 mL | Freq: Once | INTRAVENOUS | Status: AC
  Administered 2022-10-16: 1000 mL via INTRAVENOUS

## 2022-10-16 MED ORDER — HYDROCODONE-ACETAMINOPHEN 5-325 MG PO TABS
1.0000 | ORAL_TABLET | Freq: Once | ORAL | Status: AC
  Administered 2022-10-16: 1 via ORAL
  Filled 2022-10-16: qty 1

## 2022-10-16 NOTE — ED Notes (Signed)
Pt able to ambulate without assistance stating slight dizziness but no difficulty ambulating

## 2022-10-16 NOTE — ED Notes (Signed)
EDP and RN at bedside for laceration repairs.
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
## 2022-10-16 NOTE — Discharge Instructions (Addendum)
Follow-up with your dentist on Monday.  Will be very important that you see your dentist as soon as possible.  Take the antibiotics as prescribed.  Use the mouthwash as prescribed.  If you have fever, significant worsening of your pain, inability to eat, return to the emergency department.  You can use Tylenol and ibuprofen for discomfort.   Return to the ER with any new severe symptoms.

## 2023-09-20 ENCOUNTER — Other Ambulatory Visit: Payer: Self-pay

## 2023-09-20 ENCOUNTER — Inpatient Hospital Stay (HOSPITAL_COMMUNITY)
Admission: EM | Admit: 2023-09-20 | Discharge: 2023-09-27 | DRG: 439 | Disposition: A | Discharge status: Home / Self Care | Payer: Self-pay | Attending: Family Medicine | Admitting: Family Medicine

## 2023-09-20 ENCOUNTER — Emergency Department (HOSPITAL_COMMUNITY): Payer: Self-pay

## 2023-09-20 DIAGNOSIS — R739 Hyperglycemia, unspecified: Secondary | ICD-10-CM

## 2023-09-20 DIAGNOSIS — R197 Diarrhea, unspecified: Secondary | ICD-10-CM | POA: Diagnosis present

## 2023-09-20 DIAGNOSIS — K863 Pseudocyst of pancreas: Secondary | ICD-10-CM | POA: Diagnosis present

## 2023-09-20 DIAGNOSIS — K858 Other acute pancreatitis without necrosis or infection: Principal | ICD-10-CM

## 2023-09-20 DIAGNOSIS — K859 Acute pancreatitis without necrosis or infection, unspecified: Secondary | ICD-10-CM | POA: Diagnosis present

## 2023-09-20 DIAGNOSIS — Z789 Other specified health status: Secondary | ICD-10-CM

## 2023-09-20 DIAGNOSIS — K852 Alcohol induced acute pancreatitis without necrosis or infection: Principal | ICD-10-CM | POA: Diagnosis present

## 2023-09-20 DIAGNOSIS — K5900 Constipation, unspecified: Secondary | ICD-10-CM | POA: Diagnosis present

## 2023-09-20 DIAGNOSIS — E1165 Type 2 diabetes mellitus with hyperglycemia: Secondary | ICD-10-CM | POA: Diagnosis present

## 2023-09-20 DIAGNOSIS — E781 Pure hyperglyceridemia: Secondary | ICD-10-CM | POA: Diagnosis present

## 2023-09-20 DIAGNOSIS — K760 Fatty (change of) liver, not elsewhere classified: Secondary | ICD-10-CM

## 2023-09-20 DIAGNOSIS — K700 Alcoholic fatty liver: Secondary | ICD-10-CM | POA: Diagnosis present

## 2023-09-20 LAB — HEPATIC FUNCTION PANEL
ALT: 36 U/L (ref 0–44)
AST: 60 U/L — ABNORMAL HIGH (ref 15–41)
Albumin: 3.6 g/dL (ref 3.5–5.0)
Alkaline Phosphatase: 98 U/L (ref 38–126)
Bilirubin, Direct: 1.2 mg/dL — ABNORMAL HIGH (ref 0.0–0.2)
Indirect Bilirubin: 2.2 mg/dL — ABNORMAL HIGH (ref 0.3–0.9)
Total Bilirubin: 3.4 mg/dL — ABNORMAL HIGH (ref <1.2)

## 2023-09-20 LAB — I-STAT CHEM 8, ED
BUN: 11 mg/dL (ref 6–20)
Calcium, Ion: 0.98 mmol/L — ABNORMAL LOW (ref 1.15–1.40)
Chloride: 104 mmol/L (ref 98–111)
Creatinine, Ser: 0.6 mg/dL — ABNORMAL LOW (ref 0.61–1.24)
Glucose, Bld: 275 mg/dL — ABNORMAL HIGH (ref 70–99)
HCT: 45 % (ref 39.0–52.0)
Hemoglobin: 15.3 g/dL (ref 13.0–17.0)
Potassium: 4.2 mmol/L (ref 3.5–5.1)
Sodium: 137 mmol/L (ref 135–145)
TCO2: 23 mmol/L (ref 22–32)

## 2023-09-20 LAB — URINALYSIS, ROUTINE W REFLEX MICROSCOPIC
Bacteria, UA: NONE SEEN
Bilirubin Urine: NEGATIVE
Glucose, UA: >=500 mg/dL — AB
Hgb urine dipstick: NEGATIVE
Ketones, ur: 80 mg/dL — AB
Leukocytes,Ua: NEGATIVE
Nitrite: NEGATIVE
Protein, ur: 100 mg/dL — AB
Specific Gravity, Urine: 1.042 — ABNORMAL HIGH (ref 1.005–1.030)
pH: 5 (ref 5.0–8.0)

## 2023-09-20 LAB — COMPREHENSIVE METABOLIC PANEL
Albumin: 4 g/dL (ref 3.5–5.0)
Alkaline Phosphatase: 92 U/L (ref 38–126)
Anion gap: 15 (ref 5–15)
BUN: 11 mg/dL (ref 6–20)
CO2: 19 mmol/L — ABNORMAL LOW (ref 22–32)
Calcium: 8.6 mg/dL — ABNORMAL LOW (ref 8.9–10.3)
Chloride: 98 mmol/L (ref 98–111)
Creatinine, Ser: 0.79 mg/dL (ref 0.61–1.24)
GFR, Estimated: >60 mL/min (ref >60)
Glucose, Bld: 300 mg/dL — ABNORMAL HIGH (ref 70–99)
Sodium: 132 mmol/L — ABNORMAL LOW (ref 135–145)
Total Bilirubin: 6.5 mg/dL — ABNORMAL HIGH (ref <1.2)

## 2023-09-20 LAB — CBC
HCT: 45.2 % (ref 39.0–52.0)
Hemoglobin: 14.8 g/dL (ref 13.0–17.0)
MCH: 29.7 pg (ref 26.0–34.0)
MCHC: 32.7 g/dL (ref 30.0–36.0)
MCV: 90.6 fL (ref 80.0–100.0)
Platelets: 331 10*3/uL (ref 150–400)
RBC: 4.99 MIL/uL (ref 4.22–5.81)
RDW: 14.1 % (ref 11.5–15.5)
WBC: 15.8 10*3/uL — ABNORMAL HIGH (ref 4.0–10.5)
nRBC: 0 % (ref 0.0–0.2)

## 2023-09-20 LAB — GLUCOSE, CAPILLARY: Glucose-Capillary: 245 mg/dL — ABNORMAL HIGH (ref 70–99)

## 2023-09-20 LAB — ETHANOL: Alcohol, Ethyl (B): <10 mg/dL (ref <10)

## 2023-09-20 LAB — LIPASE, BLOOD: Lipase: 1246 U/L — ABNORMAL HIGH (ref 11–51)

## 2023-09-20 MED ORDER — SODIUM CHLORIDE 0.9 % IV SOLN: INTRAVENOUS | Status: AC

## 2023-09-20 MED ORDER — ENOXAPARIN SODIUM 40 MG/0.4ML IJ SOSY
40.0000 mg | PREFILLED_SYRINGE | INTRAMUSCULAR | Status: DC
  Administered 2023-09-21 – 2023-09-27 (×7): 40 mg via SUBCUTANEOUS
  Filled 2023-09-20 (×7): qty 0.4

## 2023-09-20 MED ORDER — PANTOPRAZOLE SODIUM 40 MG IV SOLR
40.0000 mg | Freq: Once | INTRAVENOUS | Status: AC
  Administered 2023-09-20: 40 mg via INTRAVENOUS
  Filled 2023-09-20: qty 10

## 2023-09-20 MED ORDER — ONDANSETRON 8 MG PO TBDP: 8.0000 mg | ORAL_TABLET | Freq: Once | ORAL | Status: DC

## 2023-09-20 MED ORDER — NALOXONE HCL 0.4 MG/ML IJ SOLN: 0.4000 mg | INTRAMUSCULAR | Status: DC | PRN

## 2023-09-20 MED ORDER — HYDROMORPHONE HCL 1 MG/ML IJ SOLN
1.0000 mg | INTRAMUSCULAR | Status: DC | PRN
  Administered 2023-09-21 (×4): 1 mg via INTRAVENOUS
  Filled 2023-09-20 (×4): qty 1

## 2023-09-20 MED ORDER — INSULIN ASPART 100 UNIT/ML IJ SOLN
0.0000 [IU] | INTRAMUSCULAR | Status: DC
  Administered 2023-09-20: 3 [IU] via SUBCUTANEOUS
  Administered 2023-09-21: 2 [IU] via SUBCUTANEOUS
  Administered 2023-09-21: 3 [IU] via SUBCUTANEOUS
  Administered 2023-09-21 (×2): 2 [IU] via SUBCUTANEOUS
  Administered 2023-09-21 – 2023-09-22 (×3): 1 [IU] via SUBCUTANEOUS
  Administered 2023-09-22 (×2): 2 [IU] via SUBCUTANEOUS
  Administered 2023-09-22 – 2023-09-23 (×6): 1 [IU] via SUBCUTANEOUS
  Administered 2023-09-23 – 2023-09-24 (×4): 2 [IU] via SUBCUTANEOUS
  Administered 2023-09-24: 1 [IU] via SUBCUTANEOUS
  Administered 2023-09-24: 2 [IU] via SUBCUTANEOUS
  Administered 2023-09-24: 1 [IU] via SUBCUTANEOUS
  Administered 2023-09-24: 2 [IU] via SUBCUTANEOUS
  Administered 2023-09-25: 1 [IU] via SUBCUTANEOUS
  Administered 2023-09-25: 2 [IU] via SUBCUTANEOUS
  Administered 2023-09-25 – 2023-09-26 (×4): 1 [IU] via SUBCUTANEOUS

## 2023-09-20 MED ORDER — SODIUM CHLORIDE 0.9 % IV BOLUS
1000.0000 mL | Freq: Once | INTRAVENOUS | Status: AC
  Administered 2023-09-20: 1000 mL via INTRAVENOUS

## 2023-09-20 MED ORDER — MORPHINE SULFATE (PF) 4 MG/ML IV SOLN
4.0000 mg | Freq: Once | INTRAVENOUS | Status: AC
  Administered 2023-09-20: 4 mg via INTRAVENOUS
  Filled 2023-09-20: qty 1

## 2023-09-20 MED ORDER — ONDANSETRON HCL 4 MG/2ML IJ SOLN
4.0000 mg | Freq: Four times a day (QID) | INTRAMUSCULAR | Status: DC | PRN
  Administered 2023-09-25: 4 mg via INTRAVENOUS
  Filled 2023-09-20: qty 2

## 2023-09-20 MED ORDER — METOCLOPRAMIDE HCL 5 MG/ML IJ SOLN
10.0000 mg | Freq: Once | INTRAMUSCULAR | Status: AC
  Administered 2023-09-20: 10 mg via INTRAMUSCULAR
  Filled 2023-09-20: qty 2

## 2023-09-20 MED ORDER — ACETAMINOPHEN 325 MG PO TABS
650.0000 mg | ORAL_TABLET | Freq: Once | ORAL | Status: AC
  Administered 2023-09-20: 650 mg via ORAL
  Filled 2023-09-20: qty 2

## 2023-09-20 MED ORDER — ONDANSETRON HCL 4 MG/2ML IJ SOLN
4.0000 mg | Freq: Once | INTRAMUSCULAR | Status: AC
  Administered 2023-09-20: 4 mg via INTRAVENOUS
  Filled 2023-09-20: qty 2

## 2023-09-20 MED ORDER — IOHEXOL 300 MG/ML  SOLN
100.0000 mL | Freq: Once | INTRAMUSCULAR | Status: AC | PRN
  Administered 2023-09-20: 100 mL via INTRAVENOUS

## 2023-09-20 NOTE — ED Provider Notes (Signed)
[place] EMERGENCY DEPARTMENT AT [facility] Provider Note   CSN: [id number] Arrival date & time: 09/20/23  1216     History  Chief Complaint  Patient presents with   Abdominal Pain   Emesis    Robert Townsend is a 47 y.o. male with a history of gastric ulcers and psoriasis who presents the ED today for abdominal pain. Patient has had epigastric pain with associated nausea and vomiting for the past 3 weeks as well as fevers for the past two days with a T-max of 102F.  Patient reports intermittent epigastric pain over the past several months that would resolve on its own within 2 to 3 days.  Additionally, patient reports history of gastric ulcers which is triggered by acidic or spicy foods.  He thinks he might of ate something spicy prior to onset of her symptoms.  Denies back pain, dysuria, or changes to bowel habits.  No hematemesis, dark tarry stools, or bright red blood in the stools.  He was seen at urgent care earlier today and was told his symptoms could be related to acid reflux.  They gave him Omeprazole and Maalox in the office.  Patient states that he threw up after taking the Maalox and felt worse after so he decided to come to the ED for further evaluation.  No additional complaints or concerns at this time.    Home Medications Prior to Admission medications   Medication Sig Start Date End Date Taking? Authorizing Provider  amoxicillin (AMOXIL) 250 MG capsule Take 1 capsule (250 mg total) by mouth 2 (two) times daily. 10/16/22   Linward Foster, MD  betamethasone dipropionate (DIPROLENE) 0.05 % ointment Apply topically 2 (two) times daily. 08/03/21   Wallis Bamberg, PA-C  doxycycline (VIBRA-TABS) 100 MG tablet Take 1 tablet (100 mg total) by mouth 2 (two) times daily. For skin infection 05/23/19   Collie Siad A, MD  tacrolimus (PROTOPIC) 0.1 % ointment Apply topically 2 (two) times daily. 05/23/19   Doristine Bosworth, MD      Allergies    Patient has no  known allergies.    Review of Systems   Review of Systems  Gastrointestinal:  Positive for abdominal pain and vomiting.  All other systems reviewed and are negative.   Physical Exam Updated Vital Signs BP (!) 127/90   Pulse 93   Temp 99.1 F (37.3 C) (Oral)   Resp 13   SpO2 94%  Physical Exam Vitals and nursing note reviewed.  Constitutional:      General: He is not in acute distress.    Appearance: Normal appearance.  HENT:     Head: Normocephalic and atraumatic.     Mouth/Throat:     Mouth: Mucous membranes are moist.  Eyes:     Conjunctiva/sclera: Conjunctivae normal.     Pupils: Pupils are equal, round, and reactive to light.  Cardiovascular:     Rate and Rhythm: Normal rate and regular rhythm.     Pulses: Normal pulses.     Heart sounds: Normal heart sounds.  Pulmonary:     Effort: Pulmonary effort is normal.     Breath sounds: Normal breath sounds.  Abdominal:     General: There is no distension.     Palpations: Abdomen is soft.     Tenderness: There is abdominal tenderness. There is no guarding or rebound.     Comments: Epigastric and LLQ tenderness to palpation without guarding or rebound  Skin:    General: Skin is  warm and dry.     Findings: No rash.  Neurological:     General: No focal deficit present.     Mental Status: He is alert.     Motor: No weakness.  Psychiatric:        Mood and Affect: Mood normal.        Behavior: Behavior normal.    ED Results / Procedures / Treatments   Labs (all labs ordered are listed, but only abnormal results are displayed) Labs Reviewed  CBC - Abnormal; Notable for the following components:      Result Value   WBC 15.8 (*)    All other components within normal limits  URINALYSIS, ROUTINE W REFLEX MICROSCOPIC - Abnormal; Notable for the following components:   Specific Gravity, Urine 1.042 (*)    Glucose, UA >=500 (*)    Ketones, ur 80 (*)    Protein, ur 100 (*)    All other components within normal limits   COMPREHENSIVE METABOLIC PANEL - Abnormal; Notable for the following components:   Sodium 132 (*)    CO2 19 (*)    Glucose, Bld 300 (*)    Calcium 8.6 (*)    Total Bilirubin 6.5 (*)    All other components within normal limits  LIPASE, BLOOD - Abnormal; Notable for the following components:   Lipase 1,246 (*)    All other components within normal limits  I-STAT CHEM 8, ED - Abnormal; Notable for the following components:   Creatinine, Ser 0.60 (*)    Glucose, Bld 275 (*)    Calcium, Ion 0.98 (*)    All other components within normal limits  ETHANOL  HEPATIC FUNCTION PANEL    EKG EKG Interpretation Date/Time:  Tuesday September 20 2023 12:47:17 EST Ventricular Rate:  93 PR Interval:  173 QRS Duration:  98 QT Interval:  373 QTC Calculation: 464 R Axis:   260  Text Interpretation: Sinus rhythm Left anterior fascicular block Abnormal R-wave progression, late transition Borderline T abnormalities, anterior leads Confirmed by Kristine Royal [phone]) on 09/20/2023 5:02:01 PM  Radiology CT ABDOMEN PELVIS W CONTRAST  Result Date: 09/20/2023 CLINICAL DATA:  Epigastric pain and emesis EXAM: CT ABDOMEN AND PELVIS WITH CONTRAST TECHNIQUE: Multidetector CT imaging of the abdomen and pelvis was performed using the standard protocol following bolus administration of intravenous contrast. RADIATION DOSE REDUCTION: This exam was performed according to the departmental dose-optimization program which includes automated exposure control, adjustment of the mA and/or kV according to patient size and/or use of iterative reconstruction technique. CONTRAST:  OMNIPAQUE IOHEXOL 300 MG/ML  SOLN COMPARISON:  CT chest abdomen and pelvis 10/15/2022 FINDINGS: Lower chest: Bibasilar atelectasis/scarring.  No acute abnormality. Hepatobiliary: Marked hepatic steatosis. Gallbladder and biliary tree are unremarkable. Pancreas: Pancreatic edema with adjacent inflammatory stranding and free fluid. No evidence of  pancreatic necrosis. 1.8 x 0.8 cm fluid collection in the uncinate process. Spleen: Unremarkable. Adrenals/Urinary Tract: Normal adrenal glands. Nonobstructing left nephrolithiasis. No hydronephrosis. Unremarkable bladder. Stomach/Bowel: No bowel obstruction or bowel wall thickening. Appendix and stomach are within normal limits. Wall thickening and mucosal hyperenhancement in the duodenum likely reactive secondary to pancreatitis. Vascular/Lymphatic: No significant vascular findings are present. No enlarged abdominal or pelvic lymph nodes. Reproductive: Unremarkable. Other: No free intraperitoneal air. Musculoskeletal: No acute fracture. IMPRESSION: 1. Acute uncomplicated pancreatitis. 2. 1.8 x 0.8 cm fluid collection in the uncinate process, likely a pseudocyst. 3. Marked hepatic steatosis. 4. Nonobstructing left nephrolithiasis. Electronically Signed   By: Angelique Holm.D.  On: 09/20/2023 22:12    Procedures Procedures: not indicated.   Medications Ordered in ED Medications  metoCLOPramide (REGLAN) injection 10 mg (10 mg Intramuscular Given 09/20/23 1312)  ondansetron (ZOFRAN) injection 4 mg (4 mg Intravenous Given 09/20/23 1750)  pantoprazole (PROTONIX) injection 40 mg (40 mg Intravenous Given 09/20/23 1750)  iohexol (OMNIPAQUE) 300 MG/ML solution 100 mL (100 mLs Intravenous Contrast Given 09/20/23 1913)  sodium chloride 0.9 % bolus 1,000 mL (0 mLs Intravenous Stopped 09/20/23 2139)  morphine (PF) 4 MG/ML injection 4 mg (4 mg Intravenous Given 09/20/23 2053)  acetaminophen (TYLENOL) tablet 650 mg (650 mg Oral Given 09/20/23 2139)  morphine (PF) 4 MG/ML injection 4 mg (4 mg Intravenous Given 09/20/23 2140)  sodium chloride 0.9 % bolus 1,000 mL (1,000 mLs Intravenous New Bag/Given 09/20/23 2248)    ED Course/ Medical Decision Making/ A&P                                 Medical Decision Making Amount and/or Complexity of Data Reviewed Labs: ordered.  Risk OTC drugs. Prescription  drug management. Decision regarding hospitalization.   This patient presents to the ED for concern of epigastric pain, this involves an extensive number of treatment options, and is a complaint that carries with it a high risk of complications and morbidity.   Differential diagnosis includes: pancreatitis, gastritis, gastric ulcers, IBS, IBD, gastroenteritis, etc.   Comorbidities  See HPI above   Additional History  Additional history obtained from prior records.   Cardiac Monitoring / EKG  The patient was maintained on a cardiac monitor.  I personally viewed and interpreted the cardiac monitored which showed: sinus rhythm with a heart rate of 93 bpm.   Lab Tests  I ordered and personally interpreted labs.  The pertinent results include:   Lipase of 1,246 Elevated WBC of 15.8 Glucose of 300  AST, ALT could not be read due to hemolysis - hepatic panel pending at time of admission. UA shows protein, ketones, and glucose.  No signs of infection.   Imaging Studies  I ordered imaging studies including CT abdomen/pelvis with contrast  I independently visualized and interpreted imaging which showed: acute uncomplicated pancreatitis. 1.8 x 1.0 cm pseudocyst. Marked hepatic steatosis. Non-obstructing left nephrolithiasis. I agree with the radiologist interpretation   Consultations  I requested consultation with Dr. Loney Loh with [facility],  and discussed lab and imaging findings as well as pertinent plan - they recommend: admission for further evaluation and management of symptoms.   Problem List / ED Course / Critical Interventions / Medication Management  Epigastric pain I ordered medications including: Tylenol for elevated temperature Pantoprazole for epigastric pain Zofran for nausea Morphine x2 for pain Normal saline Reevaluation of the patient after these medicines showed that the patient stayed the same I have reviewed the patients home medicines and have  made adjustments as needed   Social Determinants of Health  Access to healthcare   Test / Admission - Considered  Discussed findings with patient and family at bedside. They are agreeable with the plan.        Final Clinical Impression(s) / ED Diagnoses Final diagnoses:  Other acute pancreatitis, unspecified complication status    Rx / DC Orders ED Discharge Orders     None         Maxwell Marion, PA-C 09/20/23 2300    Wynetta Fines, MD 09/24/23 [phone]

## 2023-09-20 NOTE — ED Triage Notes (Signed)
Pt having epigastric pain, emesis x3 weeks. Seen at urgent care this am, prescribed omeprazole and maalox. Maalox made him feel worse.

## 2023-09-20 NOTE — ED Notes (Signed)
ED TO INPATIENT HANDOFF REPORT  ED Nurse Name and Phone #: Gearlean Alf Name/Age/Gender Robert Townsend 47 y.o. male Room/Bed: WA11/WA11  Code Status   Code Status: Not on file  Home/SNF/Other Home Patient oriented to: self, place, time, and situation Is this baseline? Yes   Triage Complete: Triage complete  Chief Complaint Acute pancreatitis [K85.90]  Triage Note Pt having epigastric pain, emesis x3 weeks. Seen at urgent care this am, prescribed omeprazole and maalox. Maalox made him feel worse.   Allergies No Known Allergies  Level of Care/Admitting Diagnosis ED Disposition     ED Disposition  Admit   Condition  --   Comment  Hospital Area: [facility] [[id number]]  Level of Care: Telemetry [5]  Admit to tele based on following criteria: Complex arrhythmia (Bradycardia/Tachycardia)  May place patient in observation at [facility] or [facility] if equivalent level of care is available:: Yes  Covid Evaluation: Asymptomatic - no recent exposure (last 10 days) testing not required  Diagnosis: Acute pancreatitis [577.0.ICD-9-CM]  Admitting Physician: John Giovanni [[id number]]  Attending Physician: John Giovanni [[id number]]          B Medical/Surgery History No past medical history on file. No past surgical history on file.   A IV Location/Drains/Wounds Patient Lines/Drains/Airways Status     Active Line/Drains/Airways     Name Placement date Placement time Site Days   Peripheral IV 09/20/23 20 G Left Antecubital 09/20/23  1311  Antecubital  less than 1            Intake/Output Last 24 hours  Intake/Output Summary (Last 24 hours) at 09/20/2023 2245 Last data filed at 09/20/2023 2139 Gross per 24 hour  Intake 1000 ml  Output --  Net 1000 ml    Labs/Imaging Results for orders placed or performed during the hospital encounter of 09/20/23 (from the past 48 hour(s))  CBC     Status: Abnormal   Collection Time:  09/20/23  1:07 PM  Result Value Ref Range   WBC 15.8 (H) 4.0 - 10.5 K/uL   RBC 4.99 4.22 - 5.81 MIL/uL   Hemoglobin 14.8 13.0 - 17.0 g/dL   HCT 19.1 47.8 - 29.5 %   MCV 90.6 80.0 - 100.0 fL   MCH 29.7 26.0 - 34.0 pg   MCHC 32.7 30.0 - 36.0 g/dL    Comment: CORRECTED FOR LIPEMIA   RDW 14.1 11.5 - 15.5 %   Platelets 331 150 - 400 K/uL   nRBC 0.0 0.0 - 0.2 %    Comment: Performed at [facility], [address] [place], [place] [address]  Comprehensive metabolic panel     Status: Abnormal   Collection Time: 09/20/23  2:05 PM  Result Value Ref Range   Sodium 132 (L) 135 - 145 mmol/L    Comment: POST-ULTRACENTRIFUGATION   Potassium NOT DONE 3.5 - 5.1 mmol/L    Comment: HEMOLYSIS AT THIS LEVEL MAY AFFECT RESULT NOTIFIED HAYLER,H MD 1830 AMIREHSANI F    Chloride 98 98 - 111 mmol/L   CO2 19 (L) 22 - 32 mmol/L   Glucose, Bld 300 (H) 70 - 99 mg/dL    Comment: Glucose reference range applies only to samples taken after fasting for at least 8 hours. POST-ULTRACENTRIFUGATION    BUN 11 6 - 20 mg/dL    Comment: POST-ULTRACENTRIFUGATION   Creatinine, Ser 0.79 0.61 - 1.24 mg/dL    Comment: POST-ULTRACENTRIFUGATION   Calcium 8.6 (L) 8.9 - 10.3 mg/dL  Comment: POST-ULTRACENTRIFUGATION   Total Protein NOT DONE 6.5 - 8.1 g/dL    Comment: NOTIFIED HAYLEE,H MD 1830 09/20/23 AMIREHSANI F   Albumin 4.0 3.5 - 5.0 g/dL    Comment: POST-ULTRACENTRIFUGATION   AST NOT DONE 15 - 41 U/L   ALT  0 - 44 U/L    HEMOLYZED SAMPLE. NOTIFIED [facility] MD 1830 11/12/24AMIREHSANI F   Alkaline Phosphatase 92 38 - 126 U/L    Comment: POST-ULTRACENTRIFUGATION   Total Bilirubin 6.5 (H) <1.2 mg/dL    Comment: HEMOLYSIS AT THIS LEVEL MAY AFFECT RESULT   GFR, Estimated >60 >60 mL/min    Comment: (NOTE) Calculated using the CKD-EPI Creatinine Equation (2021)    Anion gap 15 5 - 15    Comment: Performed at [facility], [address] [place], [place] [address]  Lipase, blood     Status:  Abnormal   Collection Time: 09/20/23  2:05 PM  Result Value Ref Range   Lipase 1,246 (H) 11 - 51 U/L    Comment: POST-ULTRACENTRIFUGATION RESULTS CONFIRMED BY MANUAL DILUTION Performed at [facility], [address], [place], [place] [address]   Urinalysis, Routine w reflex microscopic -Urine, Clean Catch     Status: Abnormal   Collection Time: 09/20/23  3:48 PM  Result Value Ref Range   Color, Urine YELLOW YELLOW   APPearance CLEAR CLEAR   Specific Gravity, Urine 1.042 (H) 1.005 - 1.030   pH 5.0 5.0 - 8.0   Glucose, UA >=500 (A) NEGATIVE mg/dL   Hgb urine dipstick NEGATIVE NEGATIVE   Bilirubin Urine NEGATIVE NEGATIVE   Ketones, ur 80 (A) NEGATIVE mg/dL   Protein, ur 440 (A) NEGATIVE mg/dL   Nitrite NEGATIVE NEGATIVE   Leukocytes,Ua NEGATIVE NEGATIVE   RBC / HPF 0-5 0 - 5 RBC/hpf   WBC, UA 0-5 0 - 5 WBC/hpf   Bacteria, UA NONE SEEN NONE SEEN   Squamous Epithelial / HPF 0-5 0 - 5 /HPF   Mucus PRESENT     Comment: Performed at [facility], [address], [place], [place] [address]  Ethanol     Status: None   Collection Time: 09/20/23  5:54 PM  Result Value Ref Range   Alcohol, Ethyl (B) <10 <10 mg/dL    Comment: (NOTE) Lowest detectable limit for serum alcohol is 10 mg/dL.  For medical purposes only. Performed at [facility], [address] [place], [place] [address]   I-stat chem 8, ED (not at [facility], [facility] or [facility])     Status: Abnormal   Collection Time: 09/20/23 10:41 PM  Result Value Ref Range   Sodium 137 135 - 145 mmol/L   Potassium 4.2 3.5 - 5.1 mmol/L   Chloride 104 98 - 111 mmol/L   BUN 11 6 - 20 mg/dL   Creatinine, Ser 4.03 (L) 0.61 - 1.24 mg/dL   Glucose, Bld 474 (H) 70 - 99 mg/dL    Comment: Glucose reference range applies only to samples taken after fasting for at least 8 hours.   Calcium, Ion 0.98 (L) 1.15 - 1.40 mmol/L   TCO2 23 22 - 32 mmol/L   Hemoglobin 15.3 13.0 - 17.0 g/dL   HCT 25.9 56.3 - 87.5 %   CT  ABDOMEN PELVIS W CONTRAST  Result Date: 09/20/2023 CLINICAL DATA:  Epigastric pain and emesis EXAM: CT ABDOMEN AND PELVIS WITH CONTRAST TECHNIQUE: Multidetector CT imaging of the abdomen and pelvis was performed using the standard protocol following bolus  administration of intravenous contrast. RADIATION DOSE REDUCTION: This exam was performed according to the departmental dose-optimization program which includes automated exposure control, adjustment of the mA and/or kV according to patient size and/or use of iterative reconstruction technique. CONTRAST:  OMNIPAQUE IOHEXOL 300 MG/ML  SOLN COMPARISON:  CT chest abdomen and pelvis 10/15/2022 FINDINGS: Lower chest: Bibasilar atelectasis/scarring.  No acute abnormality. Hepatobiliary: Marked hepatic steatosis. Gallbladder and biliary tree are unremarkable. Pancreas: Pancreatic edema with adjacent inflammatory stranding and free fluid. No evidence of pancreatic necrosis. 1.8 x 0.8 cm fluid collection in the uncinate process. Spleen: Unremarkable. Adrenals/Urinary Tract: Normal adrenal glands. Nonobstructing left nephrolithiasis. No hydronephrosis. Unremarkable bladder. Stomach/Bowel: No bowel obstruction or bowel wall thickening. Appendix and stomach are within normal limits. Wall thickening and mucosal hyperenhancement in the duodenum likely reactive secondary to pancreatitis. Vascular/Lymphatic: No significant vascular findings are present. No enlarged abdominal or pelvic lymph nodes. Reproductive: Unremarkable. Other: No free intraperitoneal air. Musculoskeletal: No acute fracture. IMPRESSION: 1. Acute uncomplicated pancreatitis. 2. 1.8 x 0.8 cm fluid collection in the uncinate process, likely a pseudocyst. 3. Marked hepatic steatosis. 4. Nonobstructing left nephrolithiasis. Electronically Signed   By: Minerva Fester M.D.   On: 09/20/2023 22:12    Pending Labs Unresulted Labs (From admission, onward)     Start     Ordered   09/20/23 1939  Hepatic  function panel  Once,   URGENT        09/20/23 1938            Vitals/Pain Today's Vitals   09/20/23 2000 09/20/23 2100 09/20/23 2115 09/20/23 2142  BP: 122/86 (!) 127/90    Pulse: (!) 108 93    Resp: 16 13    Temp: 100.3 F (37.9 C)   (!) 97.4 F (36.3 C)  TempSrc: Oral   Oral  SpO2: 94% 94%    PainSc: 8   7  8      Isolation Precautions No active isolations  Medications Medications  sodium chloride 0.9 % bolus 1,000 mL (has no administration in time range)  metoCLOPramide (REGLAN) injection 10 mg (10 mg Intramuscular Given 09/20/23 1312)  ondansetron (ZOFRAN) injection 4 mg (4 mg Intravenous Given 09/20/23 1750)  pantoprazole (PROTONIX) injection 40 mg (40 mg Intravenous Given 09/20/23 1750)  iohexol (OMNIPAQUE) 300 MG/ML solution 100 mL (100 mLs Intravenous Contrast Given 09/20/23 1913)  sodium chloride 0.9 % bolus 1,000 mL (0 mLs Intravenous Stopped 09/20/23 2139)  morphine (PF) 4 MG/ML injection 4 mg (4 mg Intravenous Given 09/20/23 2053)  acetaminophen (TYLENOL) tablet 650 mg (650 mg Oral Given 09/20/23 2139)  morphine (PF) 4 MG/ML injection 4 mg (4 mg Intravenous Given 09/20/23 2140)    Mobility walks     Focused Assessments Pt has abdominal pain, limited english   R Recommendations: See Admitting Provider Note  Report given to:   Additional Notes:

## 2023-09-20 NOTE — ED Notes (Signed)
Call to lab regarding delay in results of Hepatic Function Panel.

## 2023-09-20 NOTE — ED Provider Triage Note (Signed)
Emergency Medicine Provider Triage Evaluation Note  Robert Townsend , a 47 y.o. male  was evaluated in triage.  Pt complains of abdominal pain.  Review of Systems  Positive: Diffuse abd pain, vomiting, constipation Negative: fever  Physical Exam  BP 119/89 (BP Location: Left Arm)   Pulse 91   Temp 98.3 F (36.8 C) (Oral)   Resp 20   SpO2 99%  Gen:   Awake, no distress  Uncomfortable Resp:  Normal effort  MSK:   Moves extremities without difficulty  Other:  Slightly distended abdomen, diffusely tender  Medical Decision Making  Medically screening exam initiated at 12:55 PM.  Appropriate orders placed.  Robert Townsend was informed that the remainder of the evaluation will be completed by another provider, this initial triage assessment does not replace that evaluation, and the importance of remaining in the ED until their evaluation is complete.  This is the third episode in 3 weeks. Last 3-4 days. Sometimes with fever but not this time. No previous surgeries.    Robert Anis, PA-C 09/20/23 1259

## 2023-09-20 NOTE — ED Notes (Signed)
Pt was taken to CT and is now back

## 2023-09-20 NOTE — ED Notes (Signed)
Call to lab to add hepatic function panel to lab already drawn

## 2023-09-20 NOTE — Plan of Care (Signed)

## 2023-09-20 NOTE — H&P (Signed)
History and Physical    Robert Townsend NWG:[id number] DOB: 12-Aug-1976 DOA: 09/20/2023  PCP: Patient, [name]  Patient coming from: Home  Chief Complaint: Abdominal pain  HPI: Robert Townsend is a 47 y.o. male with medical history significant of psoriasis, vitiligo, alcohol use presented to ED with complaints of epigastric abdominal pain, nausea, and vomiting.  Slightly tachycardic in the ED but afebrile.  CBC notable for WBC count 15.8.  CMP notable for sodium 132, bicarb 19, glucose 300, creatinine 0.7, potassium level and LFTs could not be accurately measured due to hemolysis.  Hepatic function panel pending.  I-STAT chemistry showing sodium 137, potassium 4.2, chloride 104, bicarb 23, BUN 11, creatinine 0.6, glucose 275.  Lipase 1246.  UA with >500 glucose, 80 ketones, 100 mg/dL protein but no signs of infection.  Ethanol level undetectable.  CT abdomen pelvis showing acute uncomplicated pancreatitis.  There is a 1.8 x 0.8 cm fluid collection in the uncinate process, likely a pseudocyst.  Showing marked hepatic steatosis and nonobstructing left nephrolithiasis. Patient was given Tylenol, Reglan, morphine, Zofran, IV Protonix 40 mg, and 2 L normal saline.  [facility] called to admit.  Patient speaks Spanish.  He requested his daughter to do language interpretation for him as she speaks [place] fluently.  He declined video language interpreter.  Wife and daughter present at bedside.  Daughter states patient has had bouts of epigastric/upper abdominal pain for the past 2 weeks.  For the past 2 days his pain is much worse and today he started vomiting.  He has had 3 episodes of vomiting and has not been able to tolerate p.o. intake.  Vomit is yellow in color.  No previous history of pancreatitis or gallstones.  Patient states he had 6 or 7 beers over the weekend for his birthday but normally does not drink regularly and family confirms.  States the last time he had alcohol prior to his  birthday was 6 weeks ago and it was 2 beers at that time.  He does not have a PCP and goes to see doctors only when he feels sick.  Daughter states sometime in the past patient was told by a doctor that his blood glucose was high but they felt it was due to him being sick and the next time he had a checkup he was told that his blood glucose was normal.  Patient continues to endorse severe sharp epigastric pain despite receiving multiple doses of morphine in the ED.  No other complaints.  Review of Systems:  Review of Systems  All other systems reviewed and are negative.   No past medical history on file.  No past surgical history on file.   reports that he has never smoked. He has never used smokeless tobacco. He reports current alcohol use. No history on file for drug use.  No Known Allergies  No family history on file.  Prior to Admission medications   Medication Sig Start Date End Date Taking? Authorizing Provider  amoxicillin (AMOXIL) 250 MG capsule Take 1 capsule (250 mg total) by mouth 2 (two) times daily. 10/16/22   Linward Foster, MD  betamethasone dipropionate (DIPROLENE) 0.05 % ointment Apply topically 2 (two) times daily. 08/03/21   Wallis Bamberg, PA-C  doxycycline (VIBRA-TABS) 100 MG tablet Take 1 tablet (100 mg total) by mouth 2 (two) times daily. For skin infection 05/23/19   Collie Siad A, MD  tacrolimus (PROTOPIC) 0.1 % ointment Apply topically 2 (two) times daily. 05/23/19   Doristine Bosworth,  MD    Physical Exam: Vitals:   09/20/23 1845 09/20/23 2000 09/20/23 2100 09/20/23 2142  BP: (!) 119/90 122/86 (!) 127/90   Pulse: (!) 107 (!) 108 93   Resp: 12 16 13    Temp:  100.3 F (37.9 C)  (!) 97.4 F (36.3 C)  TempSrc:  Oral  Oral  SpO2: 98% 94% 94%     Physical Exam Vitals reviewed.  Constitutional:      General: He is not in acute distress. HENT:     Head: Normocephalic and atraumatic.     Mouth/Throat:     Mouth: Mucous membranes are dry.  Eyes:      Extraocular Movements: Extraocular movements intact.  Cardiovascular:     Rate and Rhythm: Normal rate and regular rhythm.     Pulses: Normal pulses.  Pulmonary:     Effort: Pulmonary effort is normal. No respiratory distress.     Breath sounds: Normal breath sounds. No wheezing or rales.  Abdominal:     General: Bowel sounds are normal. There is no distension.     Palpations: Abdomen is soft.     Tenderness: There is abdominal tenderness. There is guarding. There is no rebound.     Comments: Epigastrium tender to palpation with guarding  Musculoskeletal:     Cervical back: Normal range of motion.     Right lower leg: No edema.     Left lower leg: No edema.  Skin:    General: Skin is warm and dry.  Neurological:     General: No focal deficit present.     Mental Status: He is alert and oriented to person, place, and time.     Labs on Admission: I have personally reviewed following labs and imaging studies  CBC: Recent Labs  Lab 09/20/23 1307  WBC 15.8*  HGB 14.8  HCT 45.2  MCV 90.6  PLT 331   Basic Metabolic Panel: Recent Labs  Lab 09/20/23 1405  NA 132*  K NOT DONE  CL 98  CO2 19*  GLUCOSE 300*  BUN 11  CREATININE 0.79  CALCIUM 8.6*   GFR: CrCl cannot be calculated (Unknown ideal weight.). Liver Function Tests: Recent Labs  Lab 09/20/23 1405  AST NOT DONE  ALT HEMOLYZED SAMPLE. NOTIFIED HAYLER,H MD 1830 11/12/24AMIREHSANI F  ALKPHOS 92  BILITOT 6.5*  PROT NOT DONE  ALBUMIN 4.0   Recent Labs  Lab 09/20/23 1405  LIPASE 1,246*   No results for input(s): "AMMONIA" in the last 168 hours. Coagulation Profile: No results for input(s): "INR", "PROTIME" in the last 168 hours. Cardiac Enzymes: No results for input(s): "CKTOTAL", "CKMB", "CKMBINDEX", "TROPONINI" in the last 168 hours. BNP (last 3 results) No results for input(s): "PROBNP" in the last 8760 hours. HbA1C: No results for input(s): "HGBA1C" in the last 72 hours. CBG: No results for  input(s): "GLUCAP" in the last 168 hours. Lipid Profile: No results for input(s): "CHOL", "HDL", "LDLCALC", "TRIG", "CHOLHDL", "LDLDIRECT" in the last 72 hours. Thyroid Function Tests: No results for input(s): "TSH", "T4TOTAL", "FREET4", "T3FREE", "THYROIDAB" in the last 72 hours. Anemia Panel: No results for input(s): "VITAMINB12", "FOLATE", "FERRITIN", "TIBC", "IRON", "RETICCTPCT" in the last 72 hours. Urine analysis:    Component Value Date/Time   COLORURINE YELLOW 09/20/2023 1548   APPEARANCEUR CLEAR 09/20/2023 1548   LABSPEC 1.042 (H) 09/20/2023 1548   PHURINE 5.0 09/20/2023 1548   GLUCOSEU >=500 (A) 09/20/2023 1548   HGBUR NEGATIVE 09/20/2023 1548   BILIRUBINUR NEGATIVE 09/20/2023 1548  KETONESUR 80 (A) 09/20/2023 1548   PROTEINUR 100 (A) 09/20/2023 1548   NITRITE NEGATIVE 09/20/2023 1548   LEUKOCYTESUR NEGATIVE 09/20/2023 1548    Radiological Exams on Admission: CT ABDOMEN PELVIS W CONTRAST  Result Date: 09/20/2023 CLINICAL DATA:  Epigastric pain and emesis EXAM: CT ABDOMEN AND PELVIS WITH CONTRAST TECHNIQUE: Multidetector CT imaging of the abdomen and pelvis was performed using the standard protocol following bolus administration of intravenous contrast. RADIATION DOSE REDUCTION: This exam was performed according to the departmental dose-optimization program which includes automated exposure control, adjustment of the mA and/or kV according to patient size and/or use of iterative reconstruction technique. CONTRAST:  OMNIPAQUE IOHEXOL 300 MG/ML  SOLN COMPARISON:  CT chest abdomen and pelvis 10/15/2022 FINDINGS: Lower chest: Bibasilar atelectasis/scarring.  No acute abnormality. Hepatobiliary: Marked hepatic steatosis. Gallbladder and biliary tree are unremarkable. Pancreas: Pancreatic edema with adjacent inflammatory stranding and free fluid. No evidence of pancreatic necrosis. 1.8 x 0.8 cm fluid collection in the uncinate process. Spleen: Unremarkable. Adrenals/Urinary  Tract: Normal adrenal glands. Nonobstructing left nephrolithiasis. No hydronephrosis. Unremarkable bladder. Stomach/Bowel: No bowel obstruction or bowel wall thickening. Appendix and stomach are within normal limits. Wall thickening and mucosal hyperenhancement in the duodenum likely reactive secondary to pancreatitis. Vascular/Lymphatic: No significant vascular findings are present. No enlarged abdominal or pelvic lymph nodes. Reproductive: Unremarkable. Other: No free intraperitoneal air. Musculoskeletal: No acute fracture. IMPRESSION: 1. Acute uncomplicated pancreatitis. 2. 1.8 x 0.8 cm fluid collection in the uncinate process, likely a pseudocyst. 3. Marked hepatic steatosis. 4. Nonobstructing left nephrolithiasis. Electronically Signed   By: Minerva Fester M.D.   On: 09/20/2023 22:12    EKG: Independently reviewed.  Sinus rhythm, LAFB, borderline T wave abnormalities anterolaterally.  No significant change compared to previous EKG.  Assessment and Plan  Acute uncomplicated pancreatitis Patient presenting with complaints of intermittent epigastric abdominal pain over the past 2 weeks, much worse for the past 2 days.  Also multiple episodes of vomiting today and unable to tolerate p.o. intake.  He does report binge drinking over the weekend for his birthday but denies regular alcohol use otherwise and family confirms.  WBC count 15.8, tachycardia has resolved after 2 L fluid boluses.  Not hypotensive.  No signs of sepsis at this time.  Lipase 1246.  CT abdomen pelvis showing acute uncomplicated pancreatitis.  There is a 1.8 x 0.8 cm fluid collection in the uncinate process, likely a pseudocyst.  No gallstones seen on CT.  Keep n.p.o. at this time.  Continue IV fluid hydration, pain management, and antiemetic as needed.  Check triglyceride level and LFTs.  Trend lipase and WBC count.  Hyperglycemia/suspected diabetes No documented history of diabetes or previous A1c results in the chart.  Glucose in the  270s on most recent labs with normal bicarb level.  Does not appear to be in DKA.  Check A1c.  Placed on sensitive sliding scale insulin every 4 hours.  Continue to monitor metabolic panel.  Hepatic steatosis CT showing marked hepatic steatosis.  Patient denies regular alcohol use and family confirms.  LFTs pending.  DVT prophylaxis: Lovenox Code Status: Full Code (discussed with patient and his family) Family Communication: Wife and daughter at bedside. Level of care: Telemetry bed Admission status: It is my clinical opinion that referral for OBSERVATION is reasonable and necessary in this patient based on the above information provided. The aforementioned taken together are felt to place the patient at high risk for further clinical deterioration. However, it is anticipated that the  patient may be medically stable for discharge from the hospital within 24 to 48 hours.  John Giovanni MD [facility]  If 7PM-7AM, please contact night-coverage [email or web address]  09/20/2023, 10:34 PM

## 2023-09-20 NOTE — ED Notes (Signed)
Lab said they had to send the CMET and lipase to [facility] and there will be a delaly in the results.

## 2023-09-21 ENCOUNTER — Observation Stay (HOSPITAL_COMMUNITY): Payer: Self-pay

## 2023-09-21 DIAGNOSIS — K858 Other acute pancreatitis without necrosis or infection: Secondary | ICD-10-CM

## 2023-09-21 DIAGNOSIS — E1165 Type 2 diabetes mellitus with hyperglycemia: Secondary | ICD-10-CM

## 2023-09-21 DIAGNOSIS — E781 Pure hyperglyceridemia: Secondary | ICD-10-CM

## 2023-09-21 DIAGNOSIS — K859 Acute pancreatitis without necrosis or infection, unspecified: Secondary | ICD-10-CM | POA: Diagnosis present

## 2023-09-21 DIAGNOSIS — K760 Fatty (change of) liver, not elsewhere classified: Secondary | ICD-10-CM

## 2023-09-21 LAB — GLUCOSE, CAPILLARY
Glucose-Capillary: 134 mg/dL — ABNORMAL HIGH (ref 70–99)
Glucose-Capillary: 151 mg/dL — ABNORMAL HIGH (ref 70–99)
Glucose-Capillary: 155 mg/dL — ABNORMAL HIGH (ref 70–99)
Glucose-Capillary: 168 mg/dL — ABNORMAL HIGH (ref 70–99)
Glucose-Capillary: 197 mg/dL — ABNORMAL HIGH (ref 70–99)
Glucose-Capillary: 204 mg/dL — ABNORMAL HIGH (ref 70–99)

## 2023-09-21 LAB — CBC
HCT: 42.7 % (ref 39.0–52.0)
Hemoglobin: 14.9 g/dL (ref 13.0–17.0)
MCH: 31.6 pg (ref 26.0–34.0)
MCHC: 34.9 g/dL (ref 30.0–36.0)
MCV: 90.5 fL (ref 80.0–100.0)
Platelets: 217 10*3/uL (ref 150–400)
RBC: 4.72 MIL/uL (ref 4.22–5.81)
RDW: 13 % (ref 11.5–15.5)
WBC: 13.3 10*3/uL — ABNORMAL HIGH (ref 4.0–10.5)
nRBC: 0 % (ref 0.0–0.2)

## 2023-09-21 LAB — COMPREHENSIVE METABOLIC PANEL
ALT: 28 U/L (ref 0–44)
AST: 58 U/L — ABNORMAL HIGH (ref 15–41)
Albumin: 3.3 g/dL — ABNORMAL LOW (ref 3.5–5.0)
Alkaline Phosphatase: 81 U/L (ref 38–126)
Anion gap: 13 (ref 5–15)
BUN: 9 mg/dL (ref 6–20)
CO2: 19 mmol/L — ABNORMAL LOW (ref 22–32)
Calcium: 7.9 mg/dL — ABNORMAL LOW (ref 8.9–10.3)
Chloride: 103 mmol/L (ref 98–111)
Creatinine, Ser: 0.82 mg/dL (ref 0.61–1.24)
GFR, Estimated: >60 mL/min (ref >60)
Glucose, Bld: 199 mg/dL — ABNORMAL HIGH (ref 70–99)
Potassium: 4.9 mmol/L (ref 3.5–5.1)
Sodium: 135 mmol/L (ref 135–145)
Total Bilirubin: 2.4 mg/dL — ABNORMAL HIGH (ref <1.2)

## 2023-09-21 LAB — LDL CHOLESTEROL, DIRECT: Direct LDL: 46 mg/dL (ref 0–99)

## 2023-09-21 LAB — LIPID PANEL
Cholesterol: 282 mg/dL — ABNORMAL HIGH (ref 0–200)
HDL: 23 mg/dL — ABNORMAL LOW (ref >40)
LDL Cholesterol: UNDETERMINED mg/dL (ref 0–99)
Total CHOL/HDL Ratio: 12.3 {ratio}
Triglycerides: 966 mg/dL — ABNORMAL HIGH (ref <150)
VLDL: UNDETERMINED mg/dL (ref 0–40)

## 2023-09-21 LAB — HEMOGLOBIN A1C
Hgb A1c MFr Bld: 11.5 % — ABNORMAL HIGH (ref 4.8–5.6)
Mean Plasma Glucose: 283.35 mg/dL

## 2023-09-21 LAB — LIPASE, BLOOD: Lipase: 380 U/L — ABNORMAL HIGH (ref 11–51)

## 2023-09-21 LAB — TRIGLYCERIDES: Triglycerides: 1403 mg/dL — ABNORMAL HIGH (ref <150)

## 2023-09-21 LAB — HIV ANTIBODY (ROUTINE TESTING W REFLEX): HIV Screen 4th Generation wRfx: NONREACTIVE

## 2023-09-21 MED ORDER — SODIUM CHLORIDE 0.9 % IV SOLN: INTRAVENOUS | Status: AC

## 2023-09-21 MED ORDER — HYDROMORPHONE HCL 1 MG/ML IJ SOLN
1.0000 mg | INTRAMUSCULAR | Status: DC | PRN
  Administered 2023-09-22 – 2023-09-23 (×4): 1 mg via INTRAVENOUS
  Filled 2023-09-21 (×4): qty 1

## 2023-09-21 MED ORDER — LIVING WELL WITH DIABETES BOOK - IN SPANISH
Freq: Once | Status: AC
  Filled 2023-09-21: qty 1

## 2023-09-21 MED ORDER — SODIUM CHLORIDE 0.9 % IV SOLN: INTRAVENOUS | Status: DC

## 2023-09-21 MED ORDER — INSULIN STARTER KIT- PEN NEEDLES (SPANISH)
1.0000 | Freq: Once | Status: DC
  Filled 2023-09-21: qty 1

## 2023-09-21 MED ORDER — LIVING WELL WITH DIABETES BOOK
Freq: Once | Status: DC
  Filled 2023-09-21: qty 1

## 2023-09-21 MED ORDER — FENOFIBRATE 160 MG PO TABS
160.0000 mg | ORAL_TABLET | Freq: Every day | ORAL | Status: DC
  Administered 2023-09-21 – 2023-09-27 (×7): 160 mg via ORAL
  Filled 2023-09-21 (×7): qty 1

## 2023-09-21 MED ORDER — HYDROMORPHONE HCL 1 MG/ML IJ SOLN
0.5000 mg | INTRAMUSCULAR | Status: DC | PRN
  Administered 2023-09-23: 0.5 mg via INTRAVENOUS
  Filled 2023-09-21: qty 0.5

## 2023-09-21 NOTE — Hospital Course (Addendum)
47 y.o. Spanish-speaking male with past medical history of psoriasis and alcohol use presenting to [facility] emergency department with complaints of abdominal pain.    Upon evaluation in the emergency department CT imaging of the abdomen and pelvis revealed acute uncomplicated pancreatitis with a possible 1.8 x 0.8 cm pseudocyst.  Patient was initiated on intravenous fluids and the hospitalist group was then called to assess the patient for admission of the hospital.    Hospital course has been complicated by ongoing pain with repeat CT imaging of the abdomen pelvis performed on 11/16 revealing persistent pancreatitis with enlarging pseudocyst of 4.9 x 2.6 x 2.4 centimeters.

## 2023-09-21 NOTE — Progress Notes (Signed)
PROGRESS NOTE   Robert Townsend  ZOX:[id number] DOB: 1976-04-22 DOA: 09/20/2023 PCP: Patient, [name]   Date of Service: the patient was seen and examined on 09/22/2023  Brief Narrative:  47 y.o. Spanish-speaking male with past medical history of psoriasis and alcohol use presenting to [facility] emergency department with complaints of abdominal pain.    Upon evaluation in the emergency department CT imaging of the abdomen and pelvis revealed acute uncomplicated pancreatitis with a possible 1.8 x 0.8 cm pseudocyst.  Patient was initiated on intravenous fluids and the hospitalist group was then called to assess the patient for admission of the hospital.       Assessment & Plan Acute pancreatitis without infection or necrosis Acute pancreatitis likely secondary to combination of hypertriglyceridemia and binge drinking alcohol Keeping n.p.o. for now Aggressive intravenous volume resuscitation As needed opiate-based analgesics for associated substantial pain Hypertriglyceridemia  Triglycerides on arrival 1403 Hypertriglyceridemia contributing factor and patient developing pancreatitis Patient has been initiated on fenofibrate Uncontrolled type 2 diabetes mellitus with hyperglycemia, without long-term current use of insulin (HCC) Hemoglobin A1c 11.5% New diagnosis Accu-Cheks before every meal and nightly with sliding scale insulin  Hepatic steatosis Incidental finding on CT Counseling on weight loss and caloric restriction along with abstinence of alcohol.    Subjective:  Patient complaining of abdominal pain, epigastric in location, moderate to severe in intensity, sharp in quality, nonradiating.  Physical Exam:  Vitals:   09/21/23 1911 09/22/23 0523 09/22/23 1518 09/22/23 1926  BP: 121/83 112/72 107/75 119/82  Pulse: 99 83 80 73  Resp: 15 18  16   Temp: (!) 100.5 F (38.1 C) 98.8 F (37.1 C) 98.3 F (36.8 C) 98.4 F (36.9 C)  TempSrc:    Oral   SpO2: 97% 96% 96% 99%  Weight:      Height:        Constitutional: Awake alert and oriented x3, patient in distress due to abdominal pain Skin: no rashes, no lesions, good skin turgor noted. Eyes: Pupils are equally reactive to light.  No evidence of scleral icterus or conjunctival pallor.  ENMT: Moist mucous membranes noted.  Posterior pharynx clear of any exudate or lesions.   Respiratory: clear to auscultation bilaterally, no wheezing, no crackles. Normal respiratory effort. No accessory muscle use.  Cardiovascular: Regular rate and rhythm, no murmurs / rubs / gallops. No extremity edema. 2+ pedal pulses. No carotid bruits.  Abdomen: Soft abdomen with significant epigastric tenderness.  No evidence of intra-abdominal masses.  Positive bowel sounds noted in all quadrants.   Musculoskeletal: No joint deformity upper and lower extremities. Good ROM, no contractures. Normal muscle tone.    Data Reviewed:  I have personally reviewed and interpreted labs, imaging.  Significant findings are   CBC: Recent Labs  Lab 09/20/23 1307 09/20/23 2241 09/21/23 0512 09/22/23 0819  WBC 15.8*  --  13.3* 11.2*  NEUTROABS  --   --   --  8.0*  HGB 14.8 15.3 14.9 11.8*  HCT 45.2 45.0 42.7 34.7*  MCV 90.6  --  90.5 90.4  PLT 331  --  217 144*   Basic Metabolic Panel: Recent Labs  Lab 09/20/23 1405 09/20/23 2241 09/21/23 0512 09/22/23 0819  NA 132* 137 135 130*  K NOT DONE 4.2 4.9 3.3*  CL 98 104 103 102  CO2 19*  --  19* 21*  GLUCOSE 300* 275* 199* 142*  BUN 11 11 9 8   CREATININE 0.79 0.60* 0.82 0.70  CALCIUM 8.6*  --  7.9* 7.9*  MG  --   --   --  2.2   GFR: Estimated Creatinine Clearance: 117.2 mL/min (by C-G formula based on SCr of 0.7 mg/dL). Liver Function Tests: Recent Labs  Lab 09/20/23 1405 09/20/23 1959 09/21/23 0512 09/22/23 0819  AST NOT DONE 60* 58* 15  ALT HEMOLYZED SAMPLE. NOTIFIED HAYLER,H MD 1830 11/12/24AMIREHSANI F 36 28 15  ALKPHOS 92 98 81 68  BILITOT  6.5* 3.4* 2.4* 1.2*  PROT NOT DONE NOT DONE RESULTS UNAVAILABLE DUE TO INTERFERING SUBSTANCE 6.2*  ALBUMIN 4.0 3.6 3.3* 2.8*    Coagulation Profile: No results for input(s): "INR", "PROTIME" in the last 168 hours.   EKG: Personally reviewed.  Rhythm is normal sinus rhythm with heart rate of 93 bpm.  QTc of 464 no dynamic ST segment changes appreciated.   Code Status:  Full code.  Code status decision has been confirmed with: patient Family Communication: Spouse at the bedside who has been updated on plan of care   Severity of Illness:  The appropriate patient status for this patient is INPATIENT. Inpatient status is judged to be reasonable and necessary in order to provide the required intensity of service to ensure the patient's safety. The patient's presenting symptoms, physical exam findings, and initial radiographic and laboratory data in the context of their chronic comorbidities is felt to place them at high risk for further clinical deterioration. Furthermore, it is not anticipated that the patient will be medically stable for discharge from the hospital within 2 midnights of admission.   * I certify that at the point of admission it is my clinical judgment that the patient will require inpatient hospital care spanning beyond 2 midnights from the point of admission due to high intensity of service, high risk for further deterioration and high frequency of surveillance required.*  Time spent:  50 minutes  Author:  Marinda Elk MD  09/22/2023 9:11 PM

## 2023-09-21 NOTE — Inpatient Diabetes Management (Signed)
Inpatient Diabetes Program Recommendations  AACE/ADA: New Consensus Statement on Inpatient Glycemic Control (2015)  Target Ranges:  Prepandial:   less than 140 mg/dL      Peak postprandial:   less than 180 mg/dL (1-2 hours)      Critically ill patients:  140 - 180 mg/dL   Lab Results  Component Value Date   GLUCAP 168 (H) 09/21/2023   HGBA1C 11.5 (H) 09/21/2023    Review of Glycemic Control  Diabetes history: New Diabetes diagnosis this admission  Current orders for Inpatient glycemic control:  Novolog 0-9 units Q4 hours  A1c 11.5% on 11/13  Spoke with patient, wife, and daughter at bedside with daughter interpreting information. Discussed new diabetes diagnosis.  Discussed A1C results (11.5%) and explained what an A1C is. Discussed basic pathophysiology of DM Type 2, basic home care, importance of checking CBGs and maintaining good CBG control to prevent long-term and short-term complications. Reviewed glucose and A1C goals. Reviewed signs and symptoms of hyperglycemia and hypoglycemia along with treatment for both. Discussed impact of nutrition, exercise, stress, sickness, and medications on diabetes control. Discussed in detail exercise regimen and lifestyle modifications with diet, beverage options, and portion sizes. ordered Living Well with diabetes booklet and encouraged patient to read through entire book. Informed patient that he may be prescribed insulin at time of discharge. Discussed insulin at [facility]. If prescribed, pt prefers the insulin pen option.  Asked patient to check his glucose at least 2 times a day and to keep a log book of glucose readings and insulin taken. Explained how the doctor he follows up with can use the log book to continue to make insulin adjustments if needed. Reviewed and demonstrated how to the insulin pen.Informed patient that RN will be asking him to self-administer insulin to ensure proper technique and ability to administer self insulin shots.    Patient verbalized understanding of information discussed and he states that he has no further questions at this time related to diabetes. Will come back and review education tomorrow. RNs to provide ongoing basic DM education at bedside with this patient and engage patient to actively check blood glucose and administer insulin injections.   Thanks, Christena Deem RN, MSN, BC-ADM Inpatient Diabetes Coordinator Team Pager [phone] (8a-5p)

## 2023-09-21 NOTE — Progress Notes (Signed)
   09/21/23 1214  TOC Brief Assessment  Insurance and Status Lapsed  Patient has primary care physician No (PCP appointment scheduled at the [facility] for 12/6 at 1pm)  Home environment has been reviewed Single family home  Prior level of function: Independent  Prior/Current Home Services No current home services  Social Determinants of Health Reivew SDOH reviewed no interventions necessary  Readmission risk has been reviewed Yes  Transition of care needs transition of care needs identified, TOC will continue to follow   Met with pt and daughter at bedside. Pt confirms he does not have a PCP but, is open to having an appointment scheduled. PCP appt scheduled and added to follow up.

## 2023-09-22 ENCOUNTER — Inpatient Hospital Stay (HOSPITAL_COMMUNITY): Payer: Self-pay

## 2023-09-22 ENCOUNTER — Encounter (HOSPITAL_COMMUNITY): Payer: Self-pay | Admitting: Internal Medicine

## 2023-09-22 DIAGNOSIS — E781 Pure hyperglyceridemia: Secondary | ICD-10-CM

## 2023-09-22 DIAGNOSIS — E1165 Type 2 diabetes mellitus with hyperglycemia: Secondary | ICD-10-CM

## 2023-09-22 LAB — GLUCOSE, CAPILLARY
Glucose-Capillary: 155 mg/dL — ABNORMAL HIGH (ref 70–99)
Glucose-Capillary: 140 mg/dL — ABNORMAL HIGH (ref 70–99)
Glucose-Capillary: 152 mg/dL — ABNORMAL HIGH (ref 70–99)
Glucose-Capillary: 149 mg/dL — ABNORMAL HIGH (ref 70–99)
Glucose-Capillary: 135 mg/dL — ABNORMAL HIGH (ref 70–99)

## 2023-09-22 LAB — COMPREHENSIVE METABOLIC PANEL
ALT: 15 U/L (ref 0–44)
AST: 15 U/L (ref 15–41)
Albumin: 2.8 g/dL — ABNORMAL LOW (ref 3.5–5.0)
Alkaline Phosphatase: 68 U/L (ref 38–126)
Anion gap: 7 (ref 5–15)
BUN: 8 mg/dL (ref 6–20)
CO2: 21 mmol/L — ABNORMAL LOW (ref 22–32)
Calcium: 7.9 mg/dL — ABNORMAL LOW (ref 8.9–10.3)
Chloride: 102 mmol/L (ref 98–111)
Creatinine, Ser: 0.7 mg/dL (ref 0.61–1.24)
GFR, Estimated: >60 mL/min (ref >60)
Glucose, Bld: 142 mg/dL — ABNORMAL HIGH (ref 70–99)
Potassium: 3.3 mmol/L — ABNORMAL LOW (ref 3.5–5.1)
Sodium: 130 mmol/L — ABNORMAL LOW (ref 135–145)
Total Bilirubin: 1.2 mg/dL — ABNORMAL HIGH (ref <1.2)
Total Protein: 6.2 g/dL — ABNORMAL LOW (ref 6.5–8.1)

## 2023-09-22 LAB — CBC WITH DIFFERENTIAL/PLATELET
Abs Immature Granulocytes: 0.05 10*3/uL (ref 0.00–0.07)
Basophils Absolute: 0 10*3/uL (ref 0.0–0.1)
Basophils Relative: 0 %
Eosinophils Absolute: 0.3 10*3/uL (ref 0.0–0.5)
Eosinophils Relative: 3 %
HCT: 34.7 % — ABNORMAL LOW (ref 39.0–52.0)
Hemoglobin: 11.8 g/dL — ABNORMAL LOW (ref 13.0–17.0)
Immature Granulocytes: 0 %
Lymphocytes Relative: 16 %
Lymphs Abs: 1.8 10*3/uL (ref 0.7–4.0)
MCH: 30.7 pg (ref 26.0–34.0)
MCHC: 34 g/dL (ref 30.0–36.0)
MCV: 90.4 fL (ref 80.0–100.0)
Monocytes Absolute: 0.9 10*3/uL (ref 0.1–1.0)
Monocytes Relative: 8 %
Neutro Abs: 8 10*3/uL — ABNORMAL HIGH (ref 1.7–7.7)
Neutrophils Relative %: 73 %
Platelets: 144 10*3/uL — ABNORMAL LOW (ref 150–400)
RBC: 3.84 MIL/uL — ABNORMAL LOW (ref 4.22–5.81)
RDW: 13.1 % (ref 11.5–15.5)
WBC: 11.2 10*3/uL — ABNORMAL HIGH (ref 4.0–10.5)
nRBC: 0 % (ref 0.0–0.2)

## 2023-09-22 LAB — MAGNESIUM: Magnesium: 2.2 mg/dL (ref 1.7–2.4)

## 2023-09-22 LAB — SARS CORONAVIRUS 2 BY RT PCR: SARS Coronavirus 2 by RT PCR: NEGATIVE

## 2023-09-22 MED ORDER — POTASSIUM CHLORIDE CRYS ER 20 MEQ PO TBCR
40.0000 meq | EXTENDED_RELEASE_TABLET | Freq: Once | ORAL | Status: AC
  Administered 2023-09-22: 40 meq via ORAL
  Filled 2023-09-22: qty 2

## 2023-09-22 MED ORDER — HYDROMORPHONE HCL 1 MG/ML IJ SOLN
1.0000 mg | Freq: Once | INTRAMUSCULAR | Status: AC
  Administered 2023-09-22: 1 mg via INTRAVENOUS
  Filled 2023-09-22: qty 1

## 2023-09-22 MED ORDER — ACETAMINOPHEN 325 MG PO TABS
650.0000 mg | ORAL_TABLET | ORAL | Status: DC | PRN
  Administered 2023-09-22 – 2023-09-24 (×3): 650 mg via ORAL
  Filled 2023-09-22 (×4): qty 2

## 2023-09-22 NOTE — Assessment & Plan Note (Signed)
Hemoglobin A1c 11.5% New diagnosis Accu-Cheks before every meal and nightly with sliding scale insulin

## 2023-09-22 NOTE — Assessment & Plan Note (Addendum)
Incidental finding on CT Possibly secondary to history of longstanding alcohol use

## 2023-09-22 NOTE — Assessment & Plan Note (Addendum)
Acute pancreatitis likely secondary to combination of hypertriglyceridemia and binge drinking alcohol.   Transitioning patient to clear liquid diet and will monitor for tolerance. Continue intravenous volume resuscitation for now. As needed opiate-based analgesics for associated substantial pain

## 2023-09-22 NOTE — Assessment & Plan Note (Signed)
  Triglycerides on arrival 1403 Hypertriglyceridemia contributing factor and patient developing pancreatitis Patient has been initiated on fenofibrate

## 2023-09-22 NOTE — Inpatient Diabetes Management (Signed)
Inpatient Diabetes Program Recommendations  AACE/ADA: New Consensus Statement on Inpatient Glycemic Control (2015)  Target Ranges:  Prepandial:   less than 140 mg/dL      Peak postprandial:   less than 180 mg/dL (1-2 hours)      Critically ill patients:  140 - 180 mg/dL   Lab Results  Component Value Date   GLUCAP 152 (H) 09/22/2023   HGBA1C 11.5 (H) 09/21/2023    Spoke with pt, wife, and daughter at bedside. Pt requiring 1-2 units only of Novolog with each administration time. Pt may only need 1-2 oral agents with lifestyle modification and CBG checks 1-2 times a day at time of discharge. I have a meter to give them tomorrow 11/15. Pt has not been able to consume a diet yet. Will continue to follow trends.  Thanks,  Christena Deem RN, MSN, BC-ADM Inpatient Diabetes Coordinator Team Pager [phone] (8a-5p)

## 2023-09-22 NOTE — Assessment & Plan Note (Addendum)
Hemoglobin A1c 11.5% New diagnosis Accu-Cheks before every meal and nightly with sliding scale insulin Treatment will need to be adjusted appropriately as patient is tolerating oral intake.

## 2023-09-22 NOTE — Progress Notes (Signed)
PROGRESS NOTE   Robert Townsend  ZOX:[id number] DOB: [birth year] DOA: 09/20/2023 PCP: Patient, [name]   Date of Service: the patient was seen and examined on 09/22/2023  Brief Narrative:  47 y.o. Spanish-speaking male with past medical history of psoriasis and alcohol use presenting to [facility] emergency department with complaints of abdominal pain.    Upon evaluation in the emergency department CT imaging of the abdomen and pelvis revealed acute uncomplicated pancreatitis with a possible 1.8 x 0.8 cm pseudocyst.  Patient was initiated on intravenous fluids and the hospitalist group was then called to assess the patient for admission of the hospital.       Assessment & Plan Acute pancreatitis without infection or necrosis Acute pancreatitis likely secondary to combination of hypertriglyceridemia and binge drinking alcohol.   Transitioning patient to clear liquid diet and will monitor for tolerance. Continue intravenous volume resuscitation for now. As needed opiate-based analgesics for associated substantial pain Hypertriglyceridemia  Triglycerides on arrival 1403 Hypertriglyceridemia contributing factor and patient developing pancreatitis Patient has been initiated on fenofibrate Uncontrolled type 2 diabetes mellitus with hyperglycemia, without long-term current use of insulin (HCC) Hemoglobin A1c 11.5% New diagnosis Accu-Cheks before every meal and nightly with sliding scale insulin Treatment will need to be adjusted appropriately as patient is tolerating oral intake.  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  Patient has since cut back in late 2023. Now, patient presents with pancreatitis several days after binge drinking at least 7 beers at his birthday party on 11/9. Hepatic steatosis Incidental finding on CT Possibly secondary to history of longstanding alcohol use      Subjective:  Patient complaining of abdominal pain, epigastric in location, moderate in intensity, somewhat improved since yesterday.  Physical Exam:  Vitals:   09/21/23 1911 09/22/23 0523 09/22/23 1518 09/22/23 1926  BP: 121/83 112/72 107/75 119/82  Pulse: 99 83 80 73  Resp: 15 18  16   Temp: (!) 100.5 F (38.1 C) 98.8 F (37.1 C) 98.3 F (36.8 C) 98.4 F (36.9 C)  TempSrc:    Oral  SpO2: 97% 96% 96% 99%  Weight:      Height:        Constitutional: Awake alert and oriented x3, patient in distress due to abdominal pain Skin: no rashes, no lesions, good skin turgor noted. Eyes: Pupils are equally reactive to light.  No evidence of scleral icterus or conjunctival pallor.  ENMT: Moist mucous membranes noted.  Posterior pharynx clear of any exudate or lesions.   Respiratory: clear to auscultation bilaterally, no wheezing, no crackles. Normal respiratory effort. No accessory muscle use.  Cardiovascular: Regular rate and rhythm, no murmurs / rubs / gallops. No extremity edema. 2+ pedal pulses. No carotid bruits.  Abdomen: Soft abdomen with continued, albeit improved compared to the day prior.    No evidence of intra-abdominal masses.  Positive bowel sounds noted in all quadrants.   Musculoskeletal: No joint deformity upper and lower extremities. Good ROM, no contractures. Normal muscle tone.    Data Reviewed:  I have personally reviewed and interpreted labs, imaging.  Significant findings are   CBC: Recent Labs  Lab 09/20/23 1307 09/20/23 2241 09/21/23 0512 09/22/23 0819  WBC 15.8*  --  13.3* 11.2*  NEUTROABS  --   --   --  8.0*  HGB 14.8 15.3 14.9 11.8*  HCT 45.2 45.0 42.7 34.7*  MCV 90.6  --  90.5 90.4  PLT 331  --  217 144*   Basic Metabolic Panel: Recent Labs  Lab 09/20/23 1405 09/20/23 2241 09/21/23 0512 09/22/23 0819  NA 132* 137 135 130*  K NOT DONE 4.2 4.9 3.3*  CL 98 104 103 102  CO2 19*  --  19* 21*  GLUCOSE 300* 275* 199* 142*  BUN 11 11 9 8    CREATININE 0.79 0.60* 0.82 0.70  CALCIUM 8.6*  --  7.9* 7.9*  MG  --   --   --  2.2   GFR: Estimated Creatinine Clearance: 117.2 mL/min (by C-G formula based on SCr of 0.7 mg/dL). Liver Function Tests: Recent Labs  Lab 09/20/23 1405 09/20/23 1959 09/21/23 0512 09/22/23 0819  AST NOT DONE 60* 58* 15  ALT HEMOLYZED SAMPLE. NOTIFIED HAYLER,H MD 1830 11/12/24AMIREHSANI F 36 28 15  ALKPHOS 92 98 81 68  BILITOT 6.5* 3.4* 2.4* 1.2*  PROT NOT DONE NOT DONE RESULTS UNAVAILABLE DUE TO INTERFERING SUBSTANCE 6.2*  ALBUMIN 4.0 3.6 3.3* 2.8*       Code Status:  Full code.  Code status decision has been confirmed with: patient Family Communication: Spouse at the bedside who has been updated on plan of care   Severity of Illness:  The appropriate patient status for this patient is INPATIENT. Inpatient status is judged to be reasonable and necessary in order to provide the required intensity of service to ensure the patient's safety. The patient's presenting symptoms, physical exam findings, and initial radiographic and laboratory data in the context of their chronic comorbidities is felt to place them at high risk for further clinical deterioration. Furthermore, it is not anticipated that the patient will be medically stable for discharge from the hospital within 2 midnights of admission.   * I certify that at the point of admission it is my clinical judgment that the patient will require inpatient hospital care spanning beyond 2 midnights from the point of admission due to high intensity of service, high risk for further deterioration and high frequency of surveillance required.*  Time spent:  40 minutes  Author:  Marinda Elk MD  09/22/2023 9:32 PM

## 2023-09-22 NOTE — Assessment & Plan Note (Signed)
Incidental finding on CT Counseling on weight loss and caloric restriction along with abstinence of alcohol.

## 2023-09-22 NOTE — Plan of Care (Signed)

## 2023-09-22 NOTE — Assessment & Plan Note (Signed)
Acute pancreatitis likely secondary to combination of hypertriglyceridemia and binge drinking alcohol Keeping n.p.o. for now Aggressive intravenous volume resuscitation As needed opiate-based analgesics for associated substantial pain

## 2023-09-22 NOTE — Plan of Care (Signed)
  Problem: Education: Goal: Knowledge of General Education information will improve Description: Including pain rating scale, medication(s)/side effects and non-pharmacologic comfort measures Outcome: Progressing   Problem: Health Behavior/Discharge Planning: Goal: Ability to manage health-related needs will improve Outcome: Progressing   Problem: Clinical Measurements: Goal: Ability to maintain clinical measurements within normal limits will improve Outcome: Progressing   Problem: Nutrition: Goal: Adequate nutrition will be maintained Outcome: Progressing   Problem: Pain Management: Goal: General experience of comfort will improve Outcome: Progressing   Problem: Safety: Goal: Ability to remain free from injury will improve Outcome: Progressing   Problem: Coping: Goal: Ability to adjust to condition or change in health will improve Outcome: Progressing

## 2023-09-23 DIAGNOSIS — Z789 Other specified health status: Secondary | ICD-10-CM

## 2023-09-23 LAB — CBC WITH DIFFERENTIAL/PLATELET
Abs Immature Granulocytes: 0.05 10*3/uL (ref 0.00–0.07)
Basophils Absolute: 0 10*3/uL (ref 0.0–0.1)
Basophils Relative: 0 %
Eosinophils Absolute: 0.5 10*3/uL (ref 0.0–0.5)
Eosinophils Relative: 5 %
HCT: 33.9 % — ABNORMAL LOW (ref 39.0–52.0)
Hemoglobin: 11.5 g/dL — ABNORMAL LOW (ref 13.0–17.0)
Immature Granulocytes: 1 %
Lymphocytes Relative: 16 %
Lymphs Abs: 1.6 10*3/uL (ref 0.7–4.0)
MCH: 30.5 pg (ref 26.0–34.0)
MCHC: 33.9 g/dL (ref 30.0–36.0)
MCV: 89.9 fL (ref 80.0–100.0)
Monocytes Absolute: 0.7 10*3/uL (ref 0.1–1.0)
Monocytes Relative: 7 %
Neutro Abs: 7.1 10*3/uL (ref 1.7–7.7)
Neutrophils Relative %: 71 %
Platelets: 158 10*3/uL (ref 150–400)
RBC: 3.77 MIL/uL — ABNORMAL LOW (ref 4.22–5.81)
RDW: 13 % (ref 11.5–15.5)
WBC: 10.1 10*3/uL (ref 4.0–10.5)
nRBC: 0 % (ref 0.0–0.2)

## 2023-09-23 LAB — GLUCOSE, CAPILLARY
Glucose-Capillary: 131 mg/dL — ABNORMAL HIGH (ref 70–99)
Glucose-Capillary: 132 mg/dL — ABNORMAL HIGH (ref 70–99)
Glucose-Capillary: 139 mg/dL — ABNORMAL HIGH (ref 70–99)
Glucose-Capillary: 140 mg/dL — ABNORMAL HIGH (ref 70–99)
Glucose-Capillary: 144 mg/dL — ABNORMAL HIGH (ref 70–99)
Glucose-Capillary: 151 mg/dL — ABNORMAL HIGH (ref 70–99)
Glucose-Capillary: 184 mg/dL — ABNORMAL HIGH (ref 70–99)

## 2023-09-23 LAB — COMPREHENSIVE METABOLIC PANEL
ALT: 15 U/L (ref 0–44)
AST: 15 U/L (ref 15–41)
Albumin: 2.9 g/dL — ABNORMAL LOW (ref 3.5–5.0)
Alkaline Phosphatase: 74 U/L (ref 38–126)
Anion gap: 8 (ref 5–15)
BUN: 6 mg/dL (ref 6–20)
CO2: 21 mmol/L — ABNORMAL LOW (ref 22–32)
Calcium: 8.1 mg/dL — ABNORMAL LOW (ref 8.9–10.3)
Chloride: 102 mmol/L (ref 98–111)
Creatinine, Ser: 0.38 mg/dL — ABNORMAL LOW (ref 0.61–1.24)
GFR, Estimated: >60 mL/min (ref >60)
Glucose, Bld: 145 mg/dL — ABNORMAL HIGH (ref 70–99)
Potassium: 3.4 mmol/L — ABNORMAL LOW (ref 3.5–5.1)
Sodium: 131 mmol/L — ABNORMAL LOW (ref 135–145)
Total Bilirubin: 1.4 mg/dL — ABNORMAL HIGH (ref <1.2)
Total Protein: 6.8 g/dL (ref 6.5–8.1)

## 2023-09-23 LAB — MAGNESIUM: Magnesium: 2.2 mg/dL (ref 1.7–2.4)

## 2023-09-23 LAB — LIPASE, BLOOD: Lipase: 74 U/L — ABNORMAL HIGH (ref 11–51)

## 2023-09-23 MED ORDER — OXYCODONE HCL 5 MG PO TABS
10.0000 mg | ORAL_TABLET | ORAL | Status: DC | PRN
  Administered 2023-09-23 – 2023-09-25 (×7): 10 mg via ORAL
  Filled 2023-09-23 (×8): qty 2

## 2023-09-23 MED ORDER — HYDROMORPHONE HCL 1 MG/ML IJ SOLN
1.0000 mg | INTRAMUSCULAR | Status: DC | PRN
  Administered 2023-09-26 – 2023-09-27 (×2): 1 mg via INTRAVENOUS
  Filled 2023-09-23 (×2): qty 1

## 2023-09-23 MED ORDER — POTASSIUM CHLORIDE CRYS ER 20 MEQ PO TBCR
40.0000 meq | EXTENDED_RELEASE_TABLET | Freq: Once | ORAL | Status: AC
  Administered 2023-09-23: 40 meq via ORAL
  Filled 2023-09-23: qty 2

## 2023-09-23 MED ORDER — PANTOPRAZOLE SODIUM 40 MG IV SOLR
40.0000 mg | Freq: Once | INTRAVENOUS | Status: AC
  Administered 2023-09-23: 40 mg via INTRAVENOUS
  Filled 2023-09-23: qty 10

## 2023-09-23 MED ORDER — PANTOPRAZOLE SODIUM 40 MG PO TBEC
40.0000 mg | DELAYED_RELEASE_TABLET | Freq: Every day | ORAL | Status: DC
  Administered 2023-09-24 – 2023-09-27 (×4): 40 mg via ORAL
  Filled 2023-09-23 (×4): qty 1

## 2023-09-23 MED ORDER — ROSUVASTATIN CALCIUM 10 MG PO TABS
40.0000 mg | ORAL_TABLET | Freq: Every day | ORAL | Status: DC
  Administered 2023-09-23 – 2023-09-27 (×5): 40 mg via ORAL
  Filled 2023-09-23 (×5): qty 4

## 2023-09-23 MED ORDER — LACTATED RINGERS IV SOLN
INTRAVENOUS | Status: DC
Start: 2023-09-23 — End: 2023-09-24

## 2023-09-23 NOTE — Assessment & Plan Note (Deleted)
 Hemoglobin A1c 11.5% New diagnosis Accu-Cheks before every meal and nightly with sliding scale insulin

## 2023-09-23 NOTE — Progress Notes (Signed)
PROGRESS NOTE   Robert Townsend  JYN:[id number] DOB: 12/28/1975 DOA: 09/20/2023 PCP: Patient, [name]   Date of Service: the patient was seen and examined on 09/23/2023  Brief Narrative:  47 y.o. Spanish-speaking male with past medical history of psoriasis and alcohol use presenting to [facility] emergency department with complaints of abdominal pain.    Upon evaluation in the emergency department CT imaging of the abdomen and pelvis revealed acute uncomplicated pancreatitis with a possible 1.8 x 0.8 cm pseudocyst.  Patient was initiated on intravenous fluids and the hospitalist group was then called to assess the patient for admission of the hospital.       Assessment & Plan Acute pancreatitis without infection or necrosis Acute pancreatitis likely secondary to combination of hypertriglyceridemia and binge drinking alcohol.   Patient continuing to experience pain requiring intermittent doses of opiate-based analgesics Will obtain CT imaging of abdomen and pelvis to reevaluate degree of pancreatitis. Continue intravenous volume resuscitation for now. As needed opiate-based analgesics for associated substantial pain Hypertriglyceridemia  Triglycerides on arrival 1403 Hypertriglyceridemia contributing factor and patient developing pancreatitis Adding rosuvastatin to fenofibrate.   Uncontrolled type 2 diabetes mellitus with hyperglycemia, without long-term current use of insulin (HCC) Hemoglobin A1c 11.5% New diagnosis Accu-Cheks before every meal and nightly with sliding scale insulin Treatment will need to be adjusted appropriately once patient is tolerating oral intake.  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  Patient has since cut back in late 2023. Now, patient presents with pancreatitis several days after binge drinking at least 7 beers at his birthday party on 11/9. Patient is being  counseled on abstinence daily. Hepatic steatosis Incidental finding on CT Possibly secondary to history of longstanding alcohol use     Subjective:  Patient patient continuing to complain of moderate to severe abdominal pain, epigastric in location, moderate in intensity, somewhat improved since yesterday.  Patient is additionally complaining of a 1 day history of left lower quadrant abdominal pain.  Physical Exam:  Vitals:   09/22/23 1926 09/23/23 0508 09/23/23 1444 09/23/23 1943  BP: 119/82 109/79 135/89 106/72  Pulse: 73 78 77 76  Resp: 16 13 17 18   Temp: 98.4 F (36.9 C) 98.3 F (36.8 C) 98.1 F (36.7 C) 98.7 F (37.1 C)  TempSrc: Oral Oral Oral Oral  SpO2: 99% 97% 98% 95%  Weight:      Height:        Constitutional: Awake alert and oriented x3, patient in distress due to abdominal pain Skin: no rashes, no lesions, good skin turgor noted. Eyes: Pupils are equally reactive to light.  No evidence of scleral icterus or conjunctival pallor.  ENMT: Moist mucous membranes noted.  Posterior pharynx clear of any exudate or lesions.   Respiratory: clear to auscultation bilaterally, no wheezing, no crackles. Normal respiratory effort. No accessory muscle use.  Cardiovascular: Regular rate and rhythm, no murmurs / rubs / gallops. No extremity edema. 2+ pedal pulses. No carotid bruits.  Abdomen: Soft abdomen with epigastric and left lower quadrant abdominal tenderness.    No evidence of intra-abdominal masses.  Positive bowel sounds noted in all quadrants.   Musculoskeletal: No joint deformity upper and lower extremities. Good ROM, no contractures. Normal muscle tone.    Data Reviewed:  I have personally reviewed and interpreted labs, imaging.  Significant findings are   CBC: Recent Labs  Lab 09/20/23 1307 09/20/23 2241 09/21/23 0512 09/22/23 0819 09/23/23  0520  WBC 15.8*  --  13.3* 11.2* 10.1  NEUTROABS  --   --   --  8.0* 7.1  HGB 14.8 15.3 14.9 11.8* 11.5*  HCT 45.2  45.0 42.7 34.7* 33.9*  MCV 90.6  --  90.5 90.4 89.9  PLT 331  --  217 144* 158   Basic Metabolic Panel: Recent Labs  Lab 09/20/23 1405 09/20/23 2241 09/21/23 0512 09/22/23 0819 09/23/23 0520  NA 132* 137 135 130* 131*  K NOT DONE 4.2 4.9 3.3* 3.4*  CL 98 104 103 102 102  CO2 19*  --  19* 21* 21*  GLUCOSE 300* 275* 199* 142* 145*  BUN 11 11 9 8 6   CREATININE 0.79 0.60* 0.82 0.70 0.38*  CALCIUM 8.6*  --  7.9* 7.9* 8.1*  MG  --   --   --  2.2 2.2   GFR: Estimated Creatinine Clearance: 117.2 mL/min (A) (by C-G formula based on SCr of 0.38 mg/dL (L)). Liver Function Tests: Recent Labs  Lab 09/20/23 1405 09/20/23 1959 09/21/23 0512 09/22/23 0819 09/23/23 0520  AST NOT DONE 60* 58* 15 15  ALT HEMOLYZED SAMPLE. NOTIFIED HAYLER,H MD 1830 11/12/24AMIREHSANI F 36 28 15 15   ALKPHOS 92 98 81 68 74  BILITOT 6.5* 3.4* 2.4* 1.2* 1.4*  PROT NOT DONE NOT DONE RESULTS UNAVAILABLE DUE TO INTERFERING SUBSTANCE 6.2* 6.8  ALBUMIN 4.0 3.6 3.3* 2.8* 2.9*       Code Status:  Full code.  Code status decision has been confirmed with: patient Family Communication: Spouse at the bedside who has been updated on plan of care   Severity of Illness:  The appropriate patient status for this patient is INPATIENT. Inpatient status is judged to be reasonable and necessary in order to provide the required intensity of service to ensure the patient's safety. The patient's presenting symptoms, physical exam findings, and initial radiographic and laboratory data in the context of their chronic comorbidities is felt to place them at high risk for further clinical deterioration. Furthermore, it is not anticipated that the patient will be medically stable for discharge from the hospital within 2 midnights of admission.   * I certify that at the point of admission it is my clinical judgment that the patient will require inpatient hospital care spanning beyond 2 midnights from the point of admission due to high  intensity of service, high risk for further deterioration and high frequency of surveillance required.*  Time spent:  51 minutes  Author:  Marinda Elk MD  09/23/2023 9:18 PM

## 2023-09-23 NOTE — Inpatient Diabetes Management (Signed)
Inpatient Diabetes Program Recommendations  AACE/ADA: New Consensus Statement on Inpatient Glycemic Control (2015)  Target Ranges:  Prepandial:   less than 140 mg/dL      Peak postprandial:   less than 180 mg/dL (1-2 hours)      Critically ill patients:  140 - 180 mg/dL   Lab Results  Component Value Date   GLUCAP 151 (H) 09/23/2023   HGBA1C 11.5 (H) 09/21/2023    Spoke with pt, wife, and daughter at bedside. Pt still requiring 1-2 units only of Novolog with each administration time. Pt still on liquid diet but has the desire to eat more. Appetite not at baseline yet.   Outpatient -   Recommend placing pt on at least metformin 500 mg bid.Marland KitchenMarland KitchenMay also need a low dose sulfonylurea once diet is advanced (Amaryl 1 mg Daily).   Pt was provided a glucose meter kit from [employer] funded by the [employer]. Pt without insurance. Reviewed how to check glucose with return demonstration by patient.    Thanks,  Christena Deem RN, MSN, BC-ADM Inpatient Diabetes Coordinator Team Pager [phone] (8a-5p)

## 2023-09-23 NOTE — Plan of Care (Signed)

## 2023-09-23 NOTE — Assessment & Plan Note (Deleted)
 Acute pancreatitis likely secondary to combination of hypertriglyceridemia and binge drinking alcohol Keeping n.p.o. for now Aggressive intravenous volume resuscitation As needed opiate-based analgesics for associated substantial pain

## 2023-09-23 NOTE — Assessment & Plan Note (Signed)
Patient reports remote history of regular alcohol use drinking at least 4 beers daily for years.  Patient has since cut back in late 2023. Now, patient presents with pancreatitis several days after binge drinking at least 7 beers at his birthday party on 11/9.

## 2023-09-23 NOTE — Assessment & Plan Note (Signed)
Patient reports remote history of regular alcohol use drinking at least 4 beers daily for years.  Patient has since cut back in late 2023. Now, patient presents with pancreatitis several days after binge drinking at least 7 beers at his birthday party on 11/9. Patient is being counseled on abstinence daily.

## 2023-09-23 NOTE — Assessment & Plan Note (Deleted)
  Triglycerides on arrival 1403 Hypertriglyceridemia contributing factor and patient developing pancreatitis Patient has been initiated on fenofibrate

## 2023-09-23 NOTE — Assessment & Plan Note (Signed)
Acute pancreatitis likely secondary to combination of hypertriglyceridemia and binge drinking alcohol.   Patient continuing to experience pain requiring intermittent doses of opiate-based analgesics Will obtain CT imaging of abdomen and pelvis to reevaluate degree of pancreatitis. Continue intravenous volume resuscitation for now. As needed opiate-based analgesics for associated substantial pain

## 2023-09-23 NOTE — Assessment & Plan Note (Deleted)
 Incidental finding on CT Counseling on weight loss and caloric restriction along with abstinence of alcohol.

## 2023-09-23 NOTE — Assessment & Plan Note (Signed)
Hemoglobin A1c 11.5% New diagnosis Accu-Cheks before every meal and nightly with sliding scale insulin Treatment will need to be adjusted appropriately once patient is tolerating oral intake.

## 2023-09-23 NOTE — Assessment & Plan Note (Signed)
Incidental finding on CT Possibly secondary to history of longstanding alcohol use

## 2023-09-23 NOTE — Assessment & Plan Note (Signed)
  Triglycerides on arrival 1403 Hypertriglyceridemia contributing factor and patient developing pancreatitis Adding rosuvastatin to fenofibrate.

## 2023-09-24 ENCOUNTER — Inpatient Hospital Stay (HOSPITAL_COMMUNITY): Payer: Self-pay

## 2023-09-24 DIAGNOSIS — R197 Diarrhea, unspecified: Secondary | ICD-10-CM

## 2023-09-24 LAB — COMPREHENSIVE METABOLIC PANEL
ALT: 17 U/L (ref 0–44)
AST: 16 U/L (ref 15–41)
Albumin: 3 g/dL — ABNORMAL LOW (ref 3.5–5.0)
Alkaline Phosphatase: 88 U/L (ref 38–126)
Anion gap: 10 (ref 5–15)
BUN: 6 mg/dL (ref 6–20)
CO2: 22 mmol/L (ref 22–32)
Calcium: 8.3 mg/dL — ABNORMAL LOW (ref 8.9–10.3)
Chloride: 98 mmol/L (ref 98–111)
Creatinine, Ser: 0.69 mg/dL (ref 0.61–1.24)
GFR, Estimated: >60 mL/min (ref >60)
Glucose, Bld: 146 mg/dL — ABNORMAL HIGH (ref 70–99)
Potassium: 3.5 mmol/L (ref 3.5–5.1)
Sodium: 130 mmol/L — ABNORMAL LOW (ref 135–145)
Total Bilirubin: 1.5 mg/dL — ABNORMAL HIGH (ref <1.2)
Total Protein: 6.8 g/dL (ref 6.5–8.1)

## 2023-09-24 LAB — CBC WITH DIFFERENTIAL/PLATELET
Abs Immature Granulocytes: 0.06 10*3/uL (ref 0.00–0.07)
Basophils Absolute: 0 10*3/uL (ref 0.0–0.1)
Basophils Relative: 0 %
Eosinophils Absolute: 0.5 10*3/uL (ref 0.0–0.5)
Eosinophils Relative: 5 %
HCT: 36.2 % — ABNORMAL LOW (ref 39.0–52.0)
Hemoglobin: 12.3 g/dL — ABNORMAL LOW (ref 13.0–17.0)
Immature Granulocytes: 1 %
Lymphocytes Relative: 15 %
Lymphs Abs: 1.4 10*3/uL (ref 0.7–4.0)
MCH: 30.6 pg (ref 26.0–34.0)
MCHC: 34 g/dL (ref 30.0–36.0)
MCV: 90 fL (ref 80.0–100.0)
Monocytes Absolute: 0.8 10*3/uL (ref 0.1–1.0)
Monocytes Relative: 9 %
Neutro Abs: 6.6 10*3/uL (ref 1.7–7.7)
Neutrophils Relative %: 70 %
Platelets: 194 10*3/uL (ref 150–400)
RBC: 4.02 MIL/uL — ABNORMAL LOW (ref 4.22–5.81)
RDW: 12.9 % (ref 11.5–15.5)
WBC: 9.4 10*3/uL (ref 4.0–10.5)
nRBC: 0 % (ref 0.0–0.2)

## 2023-09-24 LAB — GLUCOSE, CAPILLARY
Glucose-Capillary: 142 mg/dL — ABNORMAL HIGH (ref 70–99)
Glucose-Capillary: 155 mg/dL — ABNORMAL HIGH (ref 70–99)
Glucose-Capillary: 154 mg/dL — ABNORMAL HIGH (ref 70–99)
Glucose-Capillary: 166 mg/dL — ABNORMAL HIGH (ref 70–99)
Glucose-Capillary: 168 mg/dL — ABNORMAL HIGH (ref 70–99)

## 2023-09-24 LAB — APTT: aPTT: 30 s (ref 24–36)

## 2023-09-24 LAB — PROTIME-INR
INR: 1.1 (ref 0.8–1.2)
Prothrombin Time: 14.1 s (ref 11.4–15.2)

## 2023-09-24 LAB — PHOSPHORUS: Phosphorus: 2.6 mg/dL (ref 2.5–4.6)

## 2023-09-24 LAB — MAGNESIUM: Magnesium: 2.2 mg/dL (ref 1.7–2.4)

## 2023-09-24 MED ORDER — LINAGLIPTIN 5 MG PO TABS
5.0000 mg | ORAL_TABLET | Freq: Every day | ORAL | Status: DC
  Administered 2023-09-25 – 2023-09-27 (×3): 5 mg via ORAL
  Filled 2023-09-24 (×3): qty 1

## 2023-09-24 MED ORDER — IOHEXOL 9 MG/ML PO SOLN
500.0000 mL | ORAL | Status: AC
Start: 2023-09-24 — End: 2023-09-24
  Administered 2023-09-24 (×2): 500 mL via ORAL

## 2023-09-24 MED ORDER — IOHEXOL 300 MG/ML  SOLN
100.0000 mL | Freq: Once | INTRAMUSCULAR | Status: AC | PRN
Start: 2023-09-24 — End: 2023-09-24
  Administered 2023-09-24: 100 mL via INTRAVENOUS

## 2023-09-24 MED ORDER — GLIPIZIDE 10 MG PO TABS
5.0000 mg | ORAL_TABLET | Freq: Every day | ORAL | Status: DC
  Administered 2023-09-25 – 2023-09-27 (×3): 5 mg via ORAL
  Filled 2023-09-24 (×3): qty 1

## 2023-09-24 NOTE — Progress Notes (Signed)
PROGRESS NOTE   Robert Townsend  ZSW:[id number] DOB: 1975-11-13 DOA: 09/20/2023 PCP: Patient, [name]   Date of Service: the patient was seen and examined on 09/24/2023  Brief Narrative:  47 y.o. Spanish-speaking male with past medical history of psoriasis and alcohol use presenting to [facility] emergency department with complaints of abdominal pain.    Upon evaluation in the emergency department CT imaging of the abdomen and pelvis revealed acute uncomplicated pancreatitis with a possible 1.8 x 0.8 cm pseudocyst.  Patient was initiated on intravenous fluids and the hospitalist group was then called to assess the patient for admission of the hospital.       Assessment & Plan Acute pancreatitis without infection or necrosis Acute pancreatitis likely secondary to combination of hypertriglyceridemia and binge drinking alcohol for his birthday.   Repeat CT imaging of the abdomen pelvis with contrast today revealing continued acute pancreatitis with 4.9 x 2.6 x 2.4 cm pseudocyst. Clinically however patient seems to be improving with improving pain.  Will consider advancing diet tomorrow. Continue as needed opiate-based analgesics Attempt to transition off of intravenous fluids  Hypertriglyceridemia  Triglycerides on arrival 1403 Hypertriglyceridemia contributing factor and patient developing pancreatitis Continue rosuvastatin and fenofibrate. Uncontrolled type 2 diabetes mellitus with hyperglycemia, without long-term current use of insulin (HCC) Hemoglobin A1c 11.5% New diagnosis Accu-Cheks before every meal and nightly with sliding scale insulin Once diet advanced will initiate Januvia and glipizide Avoiding metformin for now due to ongoing diarrhea.  Acute diarrhea Patient has been experiencing diarrhea throughout the hospitalization Suspect this is secondary to concurrent pancreatitis Supportive care Alcohol use Patient reports remote history of regular  alcohol use drinking at least 4 beers daily for years.  Patient has since cut back in late 2023. Now, patient presents with pancreatitis several days after binge drinking at least 7 beers at his birthday party on 11/9. Patient is being counseled on abstinence daily. Hepatic steatosis Incidental finding on CT Possibly secondary to history of longstanding alcohol use    Subjective:  Patient complaining of epigastric pain, now mild in intensity, sharp in quality, nonradiating.  Pain continues to get worse whenever attempting to take an oral intake.  Physical Exam:  Vitals:   09/23/23 1444 09/23/23 1943 09/24/23 0510 09/24/23 0824  BP: 135/89 106/72 113/82 115/85  Pulse: 77 76 75 80  Resp: 17 18 18 16   Temp: 98.1 F (36.7 C) 98.7 F (37.1 C) 98.3 F (36.8 C) 98 F (36.7 C)  TempSrc: Oral Oral Oral Oral  SpO2: 98% 95% 98% 97%  Weight:      Height:        Constitutional: Awake alert and oriented x3, patient is not in any significant distress. Skin: no rashes, no lesions, good skin turgor noted. Eyes: Pupils are equally reactive to light.  No evidence of scleral icterus or conjunctival pallor.  ENMT: Moist mucous membranes noted.  Posterior pharynx clear of any exudate or lesions.   Respiratory: clear to auscultation bilaterally, no wheezing, no crackles. Normal respiratory effort. No accessory muscle use.  Cardiovascular: Regular rate and rhythm, no murmurs / rubs / gallops. No extremity edema. 2+ pedal pulses. No carotid bruits.  Abdomen: Notable for gastric tenderness.  Abdomen is soft.  Positive bowel sounds noted in all quadrants.   Musculoskeletal: No joint deformity upper and lower extremities. Good ROM, no contractures. Normal muscle tone.    Data Reviewed:  I have personally reviewed and interpreted labs, imaging.  Significant findings are  CBC: Recent Labs  Lab 09/20/23 1307 09/20/23 2241 09/21/23 0512 09/22/23 0819 09/23/23 0520 09/24/23 0709  WBC 15.8*  --   13.3* 11.2* 10.1 9.4  NEUTROABS  --   --   --  8.0* 7.1 6.6  HGB 14.8 15.3 14.9 11.8* 11.5* 12.3*  HCT 45.2 45.0 42.7 34.7* 33.9* 36.2*  MCV 90.6  --  90.5 90.4 89.9 90.0  PLT 331  --  217 144* 158 194   Basic Metabolic Panel: Recent Labs  Lab 09/20/23 1405 09/20/23 2241 09/21/23 0512 09/22/23 0819 09/23/23 0520 09/24/23 0709  NA 132* 137 135 130* 131* 130*  K NOT DONE 4.2 4.9 3.3* 3.4* 3.5  CL 98 104 103 102 102 98  CO2 19*  --  19* 21* 21* 22  GLUCOSE 300* 275* 199* 142* 145* 146*  BUN 11 11 9 8 6 6   CREATININE 0.79 0.60* 0.82 0.70 0.38* 0.69  CALCIUM 8.6*  --  7.9* 7.9* 8.1* 8.3*  MG  --   --   --  2.2 2.2 2.2  PHOS  --   --   --   --   --  2.6   GFR: Estimated Creatinine Clearance: 117.2 mL/min (by C-G formula based on SCr of 0.69 mg/dL). Liver Function Tests: Recent Labs  Lab 09/20/23 1959 09/21/23 0512 09/22/23 0819 09/23/23 0520 09/24/23 0709  AST 60* 58* 15 15 16   ALT 36 28 15 15 17   ALKPHOS 98 81 68 74 88  BILITOT 3.4* 2.4* 1.2* 1.4* 1.5*  PROT NOT DONE RESULTS UNAVAILABLE DUE TO INTERFERING SUBSTANCE 6.2* 6.8 6.8  ALBUMIN 3.6 3.3* 2.8* 2.9* 3.0*    Coagulation Profile: Recent Labs  Lab 09/24/23 0709  INR 1.1      Code Status:  Full code.  Code status decision has been confirmed with: patient Family Communication: Wife is at the bedside and has been updated on plan of care.   Severity of Illness:  The appropriate patient status for this patient is INPATIENT. Inpatient status is judged to be reasonable and necessary in order to provide the required intensity of service to ensure the patient's safety. The patient's presenting symptoms, physical exam findings, and initial radiographic and laboratory data in the context of their chronic comorbidities is felt to place them at high risk for further clinical deterioration. Furthermore, it is not anticipated that the patient will be medically stable for discharge from the hospital within 2 midnights of  admission.   * I certify that at the point of admission it is my clinical judgment that the patient will require inpatient hospital care spanning beyond 2 midnights from the point of admission due to high intensity of service, high risk for further deterioration and high frequency of surveillance required.*  Time spent:  40 minutes  Author:  Marinda Elk MD  09/24/2023 9:13 AM

## 2023-09-24 NOTE — Assessment & Plan Note (Addendum)
Hemoglobin A1c 11.5% New diagnosis Accu-Cheks before every meal and nightly with sliding scale insulin Once diet advanced will initiate Januvia and glipizide Avoiding metformin for now due to ongoing diarrhea.

## 2023-09-24 NOTE — Assessment & Plan Note (Signed)
Patient has been experiencing diarrhea throughout the hospitalization Suspect this is secondary to concurrent pancreatitis Supportive care

## 2023-09-24 NOTE — Assessment & Plan Note (Addendum)
Acute pancreatitis likely secondary to combination of hypertriglyceridemia and binge drinking alcohol for his birthday.   Repeat CT imaging of the abdomen pelvis with contrast today revealing continued acute pancreatitis with 4.9 x 2.6 x 2.4 cm pseudocyst. Clinically however patient seems to be improving with improving pain.  Will consider advancing diet tomorrow. Continue as needed opiate-based analgesics Attempt to transition off of intravenous fluids

## 2023-09-24 NOTE — Assessment & Plan Note (Signed)
Incidental finding on CT Possibly secondary to history of longstanding alcohol use

## 2023-09-24 NOTE — Assessment & Plan Note (Signed)
Patient reports remote history of regular alcohol use drinking at least 4 beers daily for years.  Patient has since cut back in late 2023. Now, patient presents with pancreatitis several days after binge drinking at least 7 beers at his birthday party on 11/9. Patient is being counseled on abstinence daily.

## 2023-09-24 NOTE — Plan of Care (Signed)
Pt is A&O x 4, VSS, on room air. LR at 100 ml/hr as ordered. C/o abdominal pain and pain managed with prn oxycodone. Educated pt on falls and safety precautions, plan of care, CBG monitoring. Pt verbalizes understanding.  Call bell in reach. Will continue to monitor.  Enteric precautions maintained, still waiting on stool sample to sent it to lab.    Problem: Education: Goal: Knowledge of General Education information will improve Description: Including pain rating scale, medication(s)/side effects and non-pharmacologic comfort measures Outcome: Progressing   Problem: Health Behavior/Discharge Planning: Goal: Ability to manage health-related needs will improve Outcome: Progressing   Problem: Clinical Measurements: Goal: Ability to maintain clinical measurements within normal limits will improve Outcome: Progressing Goal: Will remain free from infection Outcome: Progressing Goal: Diagnostic test results will improve Outcome: Progressing Goal: Respiratory complications will improve Outcome: Progressing Goal: Cardiovascular complication will be avoided Outcome: Progressing   Problem: Activity: Goal: Risk for activity intolerance will decrease Outcome: Progressing   Problem: Nutrition: Goal: Adequate nutrition will be maintained Outcome: Progressing   Problem: Coping: Goal: Level of anxiety will decrease Outcome: Progressing   Problem: Elimination: Goal: Will not experience complications related to bowel motility Outcome: Progressing Goal: Will not experience complications related to urinary retention Outcome: Progressing   Problem: Pain Management: Goal: General experience of comfort will improve Outcome: Progressing   Problem: Safety: Goal: Ability to remain free from injury will improve Outcome: Progressing   Problem: Skin Integrity: Goal: Risk for impaired skin integrity will decrease Outcome: Progressing   Problem: Education: Goal: Ability to describe  self-care measures that may prevent or decrease complications (Diabetes Survival Skills Education) will improve Outcome: Progressing Goal: Individualized Educational Video(s) Outcome: Progressing   Problem: Coping: Goal: Ability to adjust to condition or change in health will improve Outcome: Progressing   Problem: Fluid Volume: Goal: Ability to maintain a balanced intake and output will improve Outcome: Progressing   Problem: Health Behavior/Discharge Planning: Goal: Ability to identify and utilize available resources and services will improve Outcome: Progressing Goal: Ability to manage health-related needs will improve Outcome: Progressing   Problem: Metabolic: Goal: Ability to maintain appropriate glucose levels will improve Outcome: Progressing   Problem: Nutritional: Goal: Maintenance of adequate nutrition will improve Outcome: Progressing Goal: Progress toward achieving an optimal weight will improve Outcome: Progressing   Problem: Skin Integrity: Goal: Risk for impaired skin integrity will decrease Outcome: Progressing   Problem: Tissue Perfusion: Goal: Adequacy of tissue perfusion will improve Outcome: Progressing

## 2023-09-24 NOTE — Assessment & Plan Note (Addendum)
  Triglycerides on arrival 1403 Hypertriglyceridemia contributing factor and patient developing pancreatitis Continue rosuvastatin and fenofibrate.

## 2023-09-25 ENCOUNTER — Inpatient Hospital Stay (HOSPITAL_COMMUNITY): Payer: Self-pay

## 2023-09-25 DIAGNOSIS — R197 Diarrhea, unspecified: Secondary | ICD-10-CM

## 2023-09-25 LAB — GLUCOSE, CAPILLARY
Glucose-Capillary: 122 mg/dL — ABNORMAL HIGH (ref 70–99)
Glucose-Capillary: 128 mg/dL — ABNORMAL HIGH (ref 70–99)
Glucose-Capillary: 141 mg/dL — ABNORMAL HIGH (ref 70–99)
Glucose-Capillary: 147 mg/dL — ABNORMAL HIGH (ref 70–99)
Glucose-Capillary: 150 mg/dL — ABNORMAL HIGH (ref 70–99)
Glucose-Capillary: 164 mg/dL — ABNORMAL HIGH (ref 70–99)
Glucose-Capillary: 85 mg/dL (ref 70–99)

## 2023-09-25 LAB — COMPREHENSIVE METABOLIC PANEL
ALT: 20 U/L (ref 0–44)
AST: 20 U/L (ref 15–41)
Albumin: 3.1 g/dL — ABNORMAL LOW (ref 3.5–5.0)
Alkaline Phosphatase: 99 U/L (ref 38–126)
Anion gap: 13 (ref 5–15)
BUN: <5 mg/dL — ABNORMAL LOW (ref 6–20)
CO2: 20 mmol/L — ABNORMAL LOW (ref 22–32)
Calcium: 8.7 mg/dL — ABNORMAL LOW (ref 8.9–10.3)
Chloride: 100 mmol/L (ref 98–111)
Creatinine, Ser: 0.7 mg/dL (ref 0.61–1.24)
GFR, Estimated: >60 mL/min (ref >60)
Glucose, Bld: 154 mg/dL — ABNORMAL HIGH (ref 70–99)
Potassium: 3.7 mmol/L (ref 3.5–5.1)
Sodium: 133 mmol/L — ABNORMAL LOW (ref 135–145)
Total Bilirubin: 1.4 mg/dL — ABNORMAL HIGH (ref <1.2)
Total Protein: 7.5 g/dL (ref 6.5–8.1)

## 2023-09-25 LAB — CBC WITH DIFFERENTIAL/PLATELET
Abs Immature Granulocytes: 0.09 10*3/uL — ABNORMAL HIGH (ref 0.00–0.07)
Basophils Absolute: 0 10*3/uL (ref 0.0–0.1)
Basophils Relative: 0 %
Eosinophils Absolute: 0.4 10*3/uL (ref 0.0–0.5)
Eosinophils Relative: 4 %
HCT: 37.5 % — ABNORMAL LOW (ref 39.0–52.0)
Hemoglobin: 12.5 g/dL — ABNORMAL LOW (ref 13.0–17.0)
Immature Granulocytes: 1 %
Lymphocytes Relative: 11 %
Lymphs Abs: 1.1 10*3/uL (ref 0.7–4.0)
MCH: 29.8 pg (ref 26.0–34.0)
MCHC: 33.3 g/dL (ref 30.0–36.0)
MCV: 89.5 fL (ref 80.0–100.0)
Monocytes Absolute: 1 10*3/uL (ref 0.1–1.0)
Monocytes Relative: 9 %
Neutro Abs: 7.8 10*3/uL — ABNORMAL HIGH (ref 1.7–7.7)
Neutrophils Relative %: 75 %
Platelets: 223 10*3/uL (ref 150–400)
RBC: 4.19 MIL/uL — ABNORMAL LOW (ref 4.22–5.81)
RDW: 12.8 % (ref 11.5–15.5)
WBC: 10.5 10*3/uL (ref 4.0–10.5)
nRBC: 0 % (ref 0.0–0.2)

## 2023-09-25 LAB — LIPASE, BLOOD: Lipase: 66 U/L — ABNORMAL HIGH (ref 11–51)

## 2023-09-25 LAB — MAGNESIUM: Magnesium: 2.2 mg/dL (ref 1.7–2.4)

## 2023-09-25 NOTE — Assessment & Plan Note (Signed)
Acute pancreatitis likely secondary to combination of hypertriglyceridemia and binge drinking alcohol for his birthday.   Repeat CT imaging of the abdomen pelvis with contrast today revealing continued acute pancreatitis with 4.9 x 2.6 x 2.4 cm pseudocyst. Clinically however patient seems to be improving with improving pain.  Will consider advancing diet tomorrow. Continue as needed opiate-based analgesics Attempt to transition off of intravenous fluids

## 2023-09-25 NOTE — Plan of Care (Signed)
PT compliant with all medications and recommendations made by healthcare team. PT still experiencing pain and inability to eat without increased pain and nausea. Progressing towards discharge, VSS, blood sugars remaining well controlled

## 2023-09-25 NOTE — Assessment & Plan Note (Signed)
Incidental finding on CT Possibly secondary to history of longstanding alcohol use

## 2023-09-25 NOTE — Assessment & Plan Note (Signed)
Patient reports remote history of regular alcohol use drinking at least 4 beers daily for years.  Patient has since cut back in late 2023. Now, patient presents with pancreatitis several days after binge drinking at least 7 beers at his birthday party on 11/9. Patient is being counseled on abstinence daily.

## 2023-09-25 NOTE — Assessment & Plan Note (Signed)
Patient has been experiencing diarrhea throughout the hospitalization Suspect this is secondary to concurrent pancreatitis Supportive care

## 2023-09-25 NOTE — Assessment & Plan Note (Signed)
  Triglycerides on arrival 1403 Hypertriglyceridemia contributing factor and patient developing pancreatitis Continue rosuvastatin and fenofibrate.

## 2023-09-25 NOTE — Progress Notes (Signed)
PROGRESS NOTE   Robert Townsend  NWG:[id number] DOB: [birth year] DOA: 09/20/2023 PCP: Patient, [name]   Date of Service: the patient was seen and examined on 09/25/2023  Brief Narrative:  47 y.o. Spanish-speaking male with past medical history of psoriasis and alcohol use presenting to [facility] emergency department with complaints of abdominal pain.    Upon evaluation in the emergency department CT imaging of the abdomen and pelvis revealed acute uncomplicated pancreatitis with a possible 1.8 x 0.8 cm pseudocyst.  Patient was initiated on intravenous fluids and the hospitalist group was then called to assess the patient for admission of the hospital.      Assessment & Plan Acute pancreatitis without infection or necrosis Acute pancreatitis likely secondary to combination of hypertriglyceridemia and binge drinking alcohol on his birthday.   Repeat CT imaging of the abdomen pelvis with contrast 11/16 revealing continued acute pancreatitis with 4.9 x 2.6 x 2.4 cm pseudocyst. Attempt to provide patient with solid food this morning resulted in increased pain.  Will keep on sips of clears throughout the day and attempt a solid breakfast again tomorrow. Case discussed with Dr. Dulce Sellar who agrees with continued conservative approach.  He states that if patient continues to have difficulty with advancing diet due to continued pain into next week to formally consult GI. Continue as needed opiate-based analgesics Attempt to transition off of intravenous fluids  Hypertriglyceridemia  Triglycerides on arrival 1403 Hypertriglyceridemia contributing factor and patient developing pancreatitis Continue rosuvastatin and fenofibrate. Uncontrolled type 2 diabetes mellitus with hyperglycemia, without long-term current use of insulin (HCC) Hemoglobin A1c 11.5% New diagnosis Accu-Cheks before every meal and nightly with sliding scale insulin Initiating Januvia and glipizide Avoiding  metformin for now due to ongoing diarrhea.  Acute diarrhea Patient has been experiencing diarrhea throughout the hospitalization Suspect this is secondary to concurrent pancreatitis Improving, now down to 1 loose stool daily. 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  Patient has since cut back in late 2023. Now, patient presents with pancreatitis several days after binge drinking at least 7 beers at his birthday party on 11[birth year]9. Patient is being counseled on abstinence daily. No evidence of withdrawal. Hepatic steatosis Incidental finding on CT Possibly secondary to history of longstanding alcohol use    Subjective:  Patient complained of severe worsening of pain this morning with attempting oral intake.  Patient now states pain is mild to moderate intensity, sharp, epigastric in location.    Physical Exam:  Vitals:   09/24/23 0824 09/24/23 1410 09/24/23 2000 09/25/23 0416  BP: 115/85 (!) 125/90 116/80 117/83  Pulse: 80 74 97 81  Resp: 16 20 18 18   Temp: 98 F (36.7 C) 99.7 F (37.6 C) (!) 100.6 F (38.1 C) 98.7 F (37.1 C)  TempSrc: Oral Oral Oral Oral  SpO2: 97% 97% 95% 100%  Weight:      Height:        Constitutional: Awake alert and oriented x3, patient is not in any significant distress. Skin: no rashes, no lesions, good skin turgor noted. Eyes: Pupils are equally reactive to light.  No evidence of scleral icterus or conjunctival pallor.  ENMT: Moist mucous membranes noted.  Posterior pharynx clear of any exudate or lesions.   Respiratory: clear to auscultation bilaterally, no wheezing, no crackles. Normal respiratory effort. No accessory muscle use.  Cardiovascular: Regular rate and rhythm, no murmurs / rubs / gallops. No extremity edema. 2+ pedal  pulses. No carotid bruits.  Abdomen: Notable for mild epigastric tenderness.  Abdomen is soft.  Positive bowel sounds noted in all quadrants.    Musculoskeletal: No joint deformity upper and lower extremities. Good ROM, no contractures. Normal muscle tone.    Data Reviewed:  I have personally reviewed and interpreted labs, imaging.  Significant findings are   CBC: Recent Labs  Lab 09/21/23 0512 09/22/23 0819 09/23/23 0520 09/24/23 0709 09/25/23 0603  WBC 13.3* 11.2* 10.1 9.4 10.5  NEUTROABS  --  8.0* 7.1 6.6 7.8*  HGB 14.9 11.8* 11.5* 12.3* 12.5*  HCT 42.7 34.7* 33.9* 36.2* 37.5*  MCV 90.5 90.4 89.9 90.0 89.5  PLT 217 144* 158 194 223   Basic Metabolic Panel: Recent Labs  Lab 09/21/23 0512 09/22/23 0819 09/23/23 0520 09/24/23 0709 09/25/23 0603  NA 135 130* 131* 130* 133*  K 4.9 3.3* 3.4* 3.5 3.7  CL 103 102 102 98 100  CO2 19* 21* 21* 22 20*  GLUCOSE 199* 142* 145* 146* 154*  BUN 9 8 6 6  <5*  CREATININE 0.82 0.70 0.38* 0.69 0.70  CALCIUM 7.9* 7.9* 8.1* 8.3* 8.7*  MG  --  2.2 2.2 2.2 2.2  PHOS  --   --   --  2.6  --    GFR: Estimated Creatinine Clearance: 117.2 mL/min (by C-G formula based on SCr of 0.7 mg/dL). Liver Function Tests: Recent Labs  Lab 09/21/23 0512 09/22/23 0819 09/23/23 0520 09/24/23 0709 09/25/23 0603  AST 58* 15 15 16 20   ALT 28 15 15 17 20   ALKPHOS 81 68 74 88 99  BILITOT 2.4* 1.2* 1.4* 1.5* 1.4*  PROT RESULTS UNAVAILABLE DUE TO INTERFERING SUBSTANCE 6.2* 6.8 6.8 7.5  ALBUMIN 3.3* 2.8* 2.9* 3.0* 3.1*    Coagulation Profile: Recent Labs  Lab 09/24/23 0709  INR 1.1      Code Status:  Full code.  Code status decision has been confirmed with: patient Family Communication: Wife is at the bedside and has been updated on plan of care.   Severity of Illness:  The appropriate patient status for this patient is INPATIENT. Inpatient status is judged to be reasonable and necessary in order to provide the required intensity of service to ensure the patient's safety. The patient's presenting symptoms, physical exam findings, and initial radiographic and laboratory data in the  context of their chronic comorbidities is felt to place them at high risk for further clinical deterioration. Furthermore, it is not anticipated that the patient will be medically stable for discharge from the hospital within 2 midnights of admission.   * I certify that at the point of admission it is my clinical judgment that the patient will require inpatient hospital care spanning beyond 2 midnights from the point of admission due to high intensity of service, high risk for further deterioration and high frequency of surveillance required.*  Time spent:  50 minutes  Author:  Marinda Elk MD  09/25/2023 8:32 AM

## 2023-09-25 NOTE — Plan of Care (Signed)

## 2023-09-25 NOTE — Assessment & Plan Note (Signed)
Hemoglobin A1c 11.5% New diagnosis Accu-Cheks before every meal and nightly with sliding scale insulin Once diet advanced will initiate Januvia and glipizide Avoiding metformin for now due to ongoing diarrhea.

## 2023-09-26 LAB — GASTROINTESTINAL PANEL BY PCR, STOOL (REPLACES STOOL CULTURE)

## 2023-09-26 LAB — GLUCOSE, CAPILLARY
Glucose-Capillary: 170 mg/dL — ABNORMAL HIGH (ref 70–99)
Glucose-Capillary: 217 mg/dL — ABNORMAL HIGH (ref 70–99)
Glucose-Capillary: 207 mg/dL — ABNORMAL HIGH (ref 70–99)
Glucose-Capillary: 232 mg/dL — ABNORMAL HIGH (ref 70–99)

## 2023-09-26 MED ORDER — ENSURE ENLIVE PO LIQD
237.0000 mL | Freq: Two times a day (BID) | ORAL | Status: DC
  Administered 2023-09-27 (×2): 237 mL via ORAL

## 2023-09-26 MED ORDER — AZITHROMYCIN 250 MG PO TABS
500.0000 mg | ORAL_TABLET | Freq: Every day | ORAL | Status: DC
  Administered 2023-09-26 – 2023-09-27 (×2): 500 mg via ORAL
  Filled 2023-09-26 (×2): qty 2

## 2023-09-26 MED ORDER — INSULIN ASPART 100 UNIT/ML IJ SOLN
0.0000 [IU] | Freq: Three times a day (TID) | INTRAMUSCULAR | Status: DC
  Administered 2023-09-26: 3 [IU] via SUBCUTANEOUS
  Administered 2023-09-26: 2 [IU] via SUBCUTANEOUS
  Administered 2023-09-26 (×2): 3 [IU] via SUBCUTANEOUS
  Administered 2023-09-27: 1 [IU] via SUBCUTANEOUS
  Administered 2023-09-27: 2 [IU] via SUBCUTANEOUS

## 2023-09-26 NOTE — Progress Notes (Addendum)
PROGRESS NOTE    Robert Townsend  ZOX:[id number] DOB: 07/23/1976 DOA: 09/20/2023 PCP: Patient, [name]   Brief Narrative:  This 47 yrs old Spanish-speaking male with PMH significant of psoriasis and alcohol use presented to [facility] ED with c/o: abdominal pain.  CT A/P revealed acute uncomplicated pancreatitis with a possible 1.8 x 0.8 cm pseudocyst.  Patient was initiated on intravenous fluids and admitted for further evaluation.  Assessment & Plan:   Principal Problem:   Acute pancreatitis without infection or necrosis Active Problems:   Hepatic steatosis   Uncontrolled type 2 diabetes mellitus with hyperglycemia, without long-term current use of insulin (HCC)   Hypertriglyceridemia   Alcohol use   Acute diarrhea  Acute pancreatitis without infection or necrosis: Patient presented with abdominal pain associated with nausea and vomiting. CT A/P revealing acute pancreatitis with 4.9 x 2.6 x 2.4 cm pseudocyst. Acute pancreatitis likely secondary to combination of hypertriglyceridemia and binge drinking alcohol on his birthday.   Attempt to provide patient with solid food this morning resulted in increased pain.  Will continue on clear liquid diet.   Case discussed with Dr. Dulce Sellar who agrees with continued conservative approach.   He states that if patient continues to have difficulty with advancing diet due to continued pain into next week to formally consult GI. Continue as needed opiate-based analgesics Attempt to transition off of intravenous fluids   Hypertriglyceridemia Triglycerides on arrival 1403 Hypertriglyceridemia contributing factor and patient developing pancreatitis. Continue rosuvastatin and fenofibrate.  Type 2 diabetes with hyperglycemia, uncontrolled: Hemoglobin A1c 11.5% New diagnosis Accu-Cheks before every meal and nightly with sliding scale insulin Initiating Januvia and glipizide Avoiding metformin for now due to ongoing diarrhea.   Acute  diarrhea: > Resolved. Patient has been experiencing diarrhea throughout the hospitalization. Suspect this is secondary to concurrent pancreatitis Improving, now down to 1 loose stool daily. GI panel > campylobactor + Start Zithromax 500  daily   Alcohol use: Patient is being counseled on abstinence daily. No evidence of withdrawal.  Hepatic steatosis: Incidental finding on CT. Possibly secondary to history of longstanding alcohol use.    DVT prophylaxis: Lovenox Code Status: Full code Family Communication:No family at bed side Disposition Plan:     Status is: Inpatient Remains inpatient appropriate because: Admitted for Acute pancreatitis in the setting for ETOH use.  Patient has improved pending diet advancement.    Consultants:  Gastroenterology  Procedures: None  Antimicrobials:  Anti-infectives (From admission, onward)    None       Subjective: Patient was seen and examined at bedside.  Overnight events noted.   Patient reports doing better,  reports abdominal pain is improving.  He is tolerating clear liquid diet  Objective: Vitals:   09/25/23 1000 09/25/23 1211 09/25/23 1954 09/26/23 0444  BP:  111/79 121/77 108/76  Pulse:  81 85 82  Resp:  18 16 16   Temp: 99.2 F (37.3 C) 98.6 F (37 C) 98.6 F (37 C) 98.6 F (37 C)  TempSrc: Axillary Oral    SpO2:  93% 97% 95%  Weight:      Height:        Intake/Output Summary (Last 24 hours) at 09/26/2023 1402 Last data filed at 09/25/2023 1730 Gross per 24 hour  Intake 0 ml  Output --  Net 0 ml   Filed Weights   09/20/23 2331  Weight: 82.4 kg    Examination:  General exam: Appears calm and comfortable, not in any acute distress. Respiratory system: Clear to  auscultation. Respiratory effort normal.  RR 16 Cardiovascular system: S1 & S2 heard, RRR. No JVD, murmurs, rubs, gallops or clicks.  Gastrointestinal system: Abdomen is non distended, soft and non tender.  Normal bowel sounds heard. Central  nervous system: Alert and oriented x 3. No focal neurological deficits. Extremities: No edema, No cyanosis, No clubbing Skin: No rashes, lesions or ulcers Psychiatry: Judgement and insight appear normal. Mood & affect appropriate.     Data Reviewed: I have personally reviewed following labs and imaging studies  CBC: Recent Labs  Lab 09/21/23 0512 09/22/23 0819 09/23/23 0520 09/24/23 0709 09/25/23 0603  WBC 13.3* 11.2* 10.1 9.4 10.5  NEUTROABS  --  8.0* 7.1 6.6 7.8*  HGB 14.9 11.8* 11.5* 12.3* 12.5*  HCT 42.7 34.7* 33.9* 36.2* 37.5*  MCV 90.5 90.4 89.9 90.0 89.5  PLT 217 144* 158 194 223   Basic Metabolic Panel: Recent Labs  Lab 09/21/23 0512 09/22/23 0819 09/23/23 0520 09/24/23 0709 09/25/23 0603  NA 135 130* 131* 130* 133*  K 4.9 3.3* 3.4* 3.5 3.7  CL 103 102 102 98 100  CO2 19* 21* 21* 22 20*  GLUCOSE 199* 142* 145* 146* 154*  BUN 9 8 6 6  <5*  CREATININE 0.82 0.70 0.38* 0.69 0.70  CALCIUM 7.9* 7.9* 8.1* 8.3* 8.7*  MG  --  2.2 2.2 2.2 2.2  PHOS  --   --   --  2.6  --    GFR: Estimated Creatinine Clearance: 117.2 mL/min (by C-G formula based on SCr of 0.7 mg/dL). Liver Function Tests: Recent Labs  Lab 09/21/23 0512 09/22/23 0819 09/23/23 0520 09/24/23 0709 09/25/23 0603  AST 58* 15 15 16 20   ALT 28 15 15 17 20   ALKPHOS 81 68 74 88 99  BILITOT 2.4* 1.2* 1.4* 1.5* 1.4*  PROT RESULTS UNAVAILABLE DUE TO INTERFERING SUBSTANCE 6.2* 6.8 6.8 7.5  ALBUMIN 3.3* 2.8* 2.9* 3.0* 3.1*   Recent Labs  Lab 09/20/23 1405 09/21/23 0512 09/23/23 0520 09/25/23 0603  LIPASE 1,246* 380* 74* 66*   No results for input(s): "AMMONIA" in the last 168 hours. Coagulation Profile: Recent Labs  Lab 09/24/23 0709  INR 1.1   Cardiac Enzymes: No results for input(s): "CKTOTAL", "CKMB", "CKMBINDEX", "TROPONINI" in the last 168 hours. BNP (last 3 results) No results for input(s): "PROBNP" in the last 8760 hours. HbA1C: No results for input(s): "HGBA1C" in the last 72  hours. CBG: Recent Labs  Lab 09/25/23 1557 09/25/23 1955 09/25/23 2353 09/26/23 0745 09/26/23 1138  GLUCAP 85 150* 122* 170* 217*   Lipid Profile: No results for input(s): "CHOL", "HDL", "LDLCALC", "TRIG", "CHOLHDL", "LDLDIRECT" in the last 72 hours. Thyroid Function Tests: No results for input(s): "TSH", "T4TOTAL", "FREET4", "T3FREE", "THYROIDAB" in the last 72 hours. Anemia Panel: No results for input(s): "VITAMINB12", "FOLATE", "FERRITIN", "TIBC", "IRON", "RETICCTPCT" in the last 72 hours. Sepsis Labs: No results for input(s): "PROCALCITON", "LATICACIDVEN" in the last 168 hours.  Recent Results (from the past 240 hour(s))  SARS Coronavirus 2 by RT PCR (hospital order, performed in [facility] hospital lab) *cepheid single result test* Anterior Nasal Swab     Status: None   Collection Time: 09/22/23 11:22 AM   Specimen: Anterior Nasal Swab  Result Value Ref Range Status   SARS Coronavirus 2 by RT PCR NEGATIVE NEGATIVE Final    Comment: (NOTE) SARS-CoV-2 target nucleic acids are NOT DETECTED.  The SARS-CoV-2 RNA is generally detectable in upper and lower respiratory specimens during the acute phase of infection. The  lowest concentration of SARS-CoV-2 viral copies this assay can detect is 250 copies / mL. A negative result does not preclude SARS-CoV-2 infection and should not be used as the sole basis for treatment or other patient management decisions.  A negative result may occur with improper specimen collection / handling, submission of specimen other than nasopharyngeal swab, presence of viral mutation(s) within the areas targeted by this assay, and inadequate number of viral copies (<250 copies / mL). A negative result must be combined with clinical observations, patient history, and epidemiological information.  Fact Sheet for Patients:   RoadLapTop.co.za  Fact Sheet for Healthcare  Providers: http://kim-miller.com/  This test is not yet approved or  cleared by the [place] FDA and has been authorized for detection and/or diagnosis of SARS-CoV-2 by FDA under an Emergency Use Authorization (EUA).  This EUA will remain in effect (meaning this test can be used) for the duration of the COVID-19 declaration under Section 564(b)(1) of the Act, 21 U.S.C. section 360bbb-3(b)(1), unless the authorization is terminated or revoked sooner.  Performed at [facility], [address], [place], [place] [address]   Gastrointestinal Panel by PCR , Stool     Status: Abnormal   Collection Time: 09/23/23  1:32 PM   Specimen: Stool  Result Value Ref Range Status   Campylobacter species DETECTED (A) NOT DETECTED Final    Comment: CRITICAL RESULT CALLED TO, READ BACK BY AND VERIFIED WITH: Crawford Givens RN [facility] @2334  09/25/23 LFD    Plesimonas shigelloides NOT DETECTED NOT DETECTED Final   Salmonella species NOT DETECTED NOT DETECTED Final   Yersinia enterocolitica NOT DETECTED NOT DETECTED Final   Vibrio species NOT DETECTED NOT DETECTED Final   Vibrio cholerae NOT DETECTED NOT DETECTED Final   Enteroaggregative E coli (EAEC) NOT DETECTED NOT DETECTED Final   Enteropathogenic E coli (EPEC) NOT DETECTED NOT DETECTED Final   Enterotoxigenic E coli (ETEC) NOT DETECTED NOT DETECTED Final   Shiga like toxin producing E coli (STEC) NOT DETECTED NOT DETECTED Final   Shigella/Enteroinvasive E coli (EIEC) NOT DETECTED NOT DETECTED Final   Cryptosporidium NOT DETECTED NOT DETECTED Final   Cyclospora cayetanensis NOT DETECTED NOT DETECTED Final   Entamoeba histolytica NOT DETECTED NOT DETECTED Final   Giardia lamblia NOT DETECTED NOT DETECTED Final   Adenovirus F40/41 NOT DETECTED NOT DETECTED Final   Astrovirus NOT DETECTED NOT DETECTED Final   Norovirus GI/GII NOT DETECTED NOT DETECTED Final   Rotavirus A NOT DETECTED NOT DETECTED Final    Sapovirus (I, II, IV, and V) NOT DETECTED NOT DETECTED Final    Comment: Performed at [facility], [address], [place], [place] [address]    Radiology Studies: DG Chest 1 View  Result Date: 09/25/2023 CLINICAL DATA:  [id number] Pneumonia [id number] EXAM: CHEST  1 VIEW COMPARISON:  09/21/2023. FINDINGS: Cardiac silhouette is unremarkable. No pneumothorax or pleural effusion. The lungs are clear. The visualized skeletal structures are unremarkable. IMPRESSION: No acute cardiopulmonary process. Electronically Signed   By: Layla Maw M.D.   On: 09/25/2023 09:52    Scheduled Meds:  enoxaparin (LOVENOX) injection  40 mg Subcutaneous Q24H   fenofibrate  160 mg Oral Daily   glipiZIDE  5 mg Oral QAC breakfast   insulin aspart  0-9 Units Subcutaneous TID AC & HS   insulin starter kit- pen needles  1 kit Other Once   linagliptin  5 mg Oral Daily   pantoprazole  40 mg Oral Daily   rosuvastatin  40 mg Oral Daily   Continuous Infusions:   LOS: 5 days    Time spent: 50 mins    Willeen Niece, MD [facility]   If 7PM-7AM, please contact night-coverage

## 2023-09-26 NOTE — Plan of Care (Signed)
Pt is A&O x 4. VSS, on room air. Ambulates independently in the room. C/o abdominal pain and prn oxy given as ordered. Pt on q4h CBG and insulin given as ordered- see emar. on NPO with sips and meds and pt aware.  0000: lab called for GI stool panel results and pt tested positive for campylobacter. Chinita Greenland, NP notified via secure chat and no new orders.  Around 0200: new order for diet changed to soft diet and CBG x4 /daily -see orders.  Educated on falls and safety precautions, isolation precuations, plan of care. Pt verbalizes understanding. Call bell in reach. Will continue to monitor.   Problem: Education: Goal: Knowledge of General Education information will improve Description: Including pain rating scale, medication(s)/side effects and non-pharmacologic comfort measures Outcome: Progressing   Problem: Health Behavior/Discharge Planning: Goal: Ability to manage health-related needs will improve Outcome: Progressing   Problem: Clinical Measurements: Goal: Ability to maintain clinical measurements within normal limits will improve Outcome: Progressing Goal: Will remain free from infection Outcome: Progressing Goal: Diagnostic test results will improve Outcome: Progressing Goal: Respiratory complications will improve Outcome: Progressing Goal: Cardiovascular complication will be avoided Outcome: Progressing   Problem: Activity: Goal: Risk for activity intolerance will decrease Outcome: Progressing   Problem: Nutrition: Goal: Adequate nutrition will be maintained Outcome: Progressing   Problem: Coping: Goal: Level of anxiety will decrease Outcome: Progressing   Problem: Elimination: Goal: Will not experience complications related to bowel motility Outcome: Progressing Goal: Will not experience complications related to urinary retention Outcome: Progressing   Problem: Pain Management: Goal: General experience of comfort will improve Outcome: Progressing    Problem: Safety: Goal: Ability to remain free from injury will improve Outcome: Progressing   Problem: Skin Integrity: Goal: Risk for impaired skin integrity will decrease Outcome: Progressing   Problem: Education: Goal: Ability to describe self-care measures that may prevent or decrease complications (Diabetes Survival Skills Education) will improve Outcome: Progressing Goal: Individualized Educational Video(s) Outcome: Progressing   Problem: Coping: Goal: Ability to adjust to condition or change in health will improve Outcome: Progressing   Problem: Fluid Volume: Goal: Ability to maintain a balanced intake and output will improve Outcome: Progressing   Problem: Health Behavior/Discharge Planning: Goal: Ability to identify and utilize available resources and services will improve Outcome: Progressing Goal: Ability to manage health-related needs will improve Outcome: Progressing   Problem: Metabolic: Goal: Ability to maintain appropriate glucose levels will improve Outcome: Progressing   Problem: Nutritional: Goal: Maintenance of adequate nutrition will improve Outcome: Progressing Goal: Progress toward achieving an optimal weight will improve Outcome: Progressing   Problem: Skin Integrity: Goal: Risk for impaired skin integrity will decrease Outcome: Progressing   Problem: Tissue Perfusion: Goal: Adequacy of tissue perfusion will improve Outcome: Progressing

## 2023-09-26 NOTE — Progress Notes (Signed)
0000: lab called ([facility] ) -GI panel resulted and tested positive for campylobacter.  J.Daniel, NP notified. No new orders. Will continue to monitor.

## 2023-09-26 NOTE — Plan of Care (Signed)
  Problem: Education: Goal: Knowledge of General Education information will improve Description: Including pain rating scale, medication(s)/side effects and non-pharmacologic comfort measures Outcome: Progressing   Problem: Clinical Measurements: Goal: Ability to maintain clinical measurements within normal limits will improve Outcome: Progressing Goal: Cardiovascular complication will be avoided Outcome: Progressing   Problem: Activity: Goal: Risk for activity intolerance will decrease Outcome: Progressing   Problem: Nutrition: Goal: Adequate nutrition will be maintained Outcome: Progressing   Problem: Elimination: Goal: Will not experience complications related to urinary retention Outcome: Progressing   Problem: Safety: Goal: Ability to remain free from injury will improve Outcome: Progressing   Problem: Skin Integrity: Goal: Risk for impaired skin integrity will decrease Outcome: Progressing   Problem: Nutritional: Goal: Maintenance of adequate nutrition will improve Outcome: Progressing Goal: Progress toward achieving an optimal weight will improve Outcome: Progressing   Problem: Skin Integrity: Goal: Risk for impaired skin integrity will decrease Outcome: Progressing   Problem: Tissue Perfusion: Goal: Adequacy of tissue perfusion will improve Outcome: Progressing

## 2023-09-27 LAB — CBC
HCT: 40.5 % (ref 39.0–52.0)
Hemoglobin: 13.4 g/dL (ref 13.0–17.0)
MCH: 30 pg (ref 26.0–34.0)
MCHC: 33.1 g/dL (ref 30.0–36.0)
MCV: 90.6 fL (ref 80.0–100.0)
Platelets: 315 10*3/uL (ref 150–400)
RBC: 4.47 MIL/uL (ref 4.22–5.81)
RDW: 12.7 % (ref 11.5–15.5)
WBC: 7.6 10*3/uL (ref 4.0–10.5)
nRBC: 0 % (ref 0.0–0.2)

## 2023-09-27 LAB — BASIC METABOLIC PANEL
Anion gap: 11 (ref 5–15)
BUN: 7 mg/dL (ref 6–20)
CO2: 24 mmol/L (ref 22–32)
Calcium: 9.1 mg/dL (ref 8.9–10.3)
Chloride: 98 mmol/L (ref 98–111)
Creatinine, Ser: 0.78 mg/dL (ref 0.61–1.24)
GFR, Estimated: >60 mL/min (ref >60)
Glucose, Bld: 211 mg/dL — ABNORMAL HIGH (ref 70–99)
Potassium: 3.9 mmol/L (ref 3.5–5.1)
Sodium: 133 mmol/L — ABNORMAL LOW (ref 135–145)

## 2023-09-27 LAB — PHOSPHORUS: Phosphorus: 3.2 mg/dL (ref 2.5–4.6)

## 2023-09-27 LAB — GLUCOSE, CAPILLARY
Glucose-Capillary: 183 mg/dL — ABNORMAL HIGH (ref 70–99)
Glucose-Capillary: 129 mg/dL — ABNORMAL HIGH (ref 70–99)

## 2023-09-27 LAB — MAGNESIUM: Magnesium: 2.5 mg/dL — ABNORMAL HIGH (ref 1.7–2.4)

## 2023-09-27 MED ORDER — AZITHROMYCIN 250 MG PO TABS: ORAL_TABLET | ORAL | 0 refills | Status: AC

## 2023-09-27 MED ORDER — GLIPIZIDE 5 MG PO TABS: 5.0000 mg | ORAL_TABLET | Freq: Every day | ORAL | 0 refills | Status: AC

## 2023-09-27 MED ORDER — INSULIN STARTER KIT- PEN NEEDLES (SPANISH): 1.0000 | Freq: Once | 0 refills | Status: AC

## 2023-09-27 MED ORDER — LINAGLIPTIN 5 MG PO TABS: 5.0000 mg | ORAL_TABLET | Freq: Every day | ORAL | 0 refills | Status: AC

## 2023-09-27 MED ORDER — ROSUVASTATIN CALCIUM 40 MG PO TABS: 40.0000 mg | ORAL_TABLET | Freq: Every day | ORAL | 0 refills | Status: AC

## 2023-09-27 MED ORDER — FENOFIBRATE 160 MG PO TABS: 160.0000 mg | ORAL_TABLET | Freq: Every day | ORAL | 0 refills | Status: AC

## 2023-09-27 MED ORDER — OXYCODONE HCL 10 MG PO TABS: 10.0000 mg | ORAL_TABLET | ORAL | 0 refills | Status: AC | PRN

## 2023-09-27 MED ORDER — PANTOPRAZOLE SODIUM 40 MG PO TBEC: 40.0000 mg | DELAYED_RELEASE_TABLET | Freq: Every day | ORAL | 0 refills | Status: AC

## 2023-09-27 NOTE — Discharge Summary (Signed)
Physician Discharge Summary  Caetano Hershner ZOX:[id number] DOB: Apr 04, 1976 DOA: 09/20/2023  PCP: Patient, [name]  Admit date: 09/20/2023  Discharge date: 09/27/2023  Admitted From: Home.  Disposition:  Home.  Recommendations for Outpatient Follow-up:  Follow up with PCP in 1-2 weeks. Please obtain BMP/CBC in one week. Advised to continue Azithromycin 500 mg for 1 more day to complete 3-day treatment for Campylobacter infection. Advised to follow-up with Endocrinology in 1 week. Advised to take Januvia and linagliptin as advised.  Home Health:None Equipment/Devices:None  Discharge Condition: Stable CODE STATUS:Full code Diet recommendation: Heart Healthy   Brief [facility] Course: This 47 yrs old Spanish-speaking male with PMH significant of psoriasis and alcohol use presented to [facility] ED with c/o: abdominal pain.  CT A/P revealed acute uncomplicated pancreatitis with a possible 1.8 x 0.8 cm pseudocyst.  Patient was initiated on intravenous fluids and admitted for further evaluation.  Patient was continued on supportive care.  Acute pancreatitis was secondary to combination of hypertriglyceridemia and binge alcohol drinking on his birthday.  Patient was evaluated by gastroenterology who agreed with conservative approach.  Patient has made significant improvement,  abdominal pain has resolved.  He tolerated soft diet. Given new onset diabetes,  Patient is started on Januvia and Glipizide, which patient has been tolerating well.  Patient has developed diarrhea during hospitalization.  GI panel shows Campylobacter+,  Patient started on azithromycin.  Diarrhea has resolved.  Patient feels better and patient is being discharged home.   Discharge Diagnoses:  Principal Problem:   Acute pancreatitis without infection or necrosis Active Problems:   Hepatic steatosis   Uncontrolled type 2 diabetes mellitus with hyperglycemia, without long-term current use of insulin (HCC)    Hypertriglyceridemia   Alcohol use   Acute diarrhea  Acute pancreatitis without infection or necrosis: Patient presented with abdominal pain associated with nausea and vomiting. CT A/P revealing acute pancreatitis with 4.9 x 2.6 x 2.4 cm pseudocyst. Acute pancreatitis likely secondary to combination of hypertriglyceridemia and binge drinking alcohol on his birthday.   Attempt to provide patient with solid food this morning resulted in increased pain.  Will continue on clear liquid diet.   Case discussed with Dr. Dulce Sellar who agrees with continued conservative approach.   He states that if patient continues to have difficulty with advancing diet due to continued pain into next week to formally consult GI. Continue as needed opiate-based analgesics Attempt to transition off of intravenous fluids. Patient was placed on soft diet and  he tolerated well.   Hypertriglyceridemia Triglycerides on arrival 1403 Hypertriglyceridemia contributing factor and patient developing pancreatitis. Continue rosuvastatin and fenofibrate.   Type 2 diabetes with hyperglycemia, uncontrolled: Hemoglobin A1c 11.5% New diagnosis Accu-Cheks before every meal and nightly with sliding scale insulin Initiating Januvia and glipizide Avoiding metformin for now due to ongoing diarrhea.   Acute diarrhea: > Resolved. Patient has been experiencing diarrhea throughout the hospitalization. Suspect this is secondary to concurrent pancreatitis Improving, now down to 1 loose stool daily. GI panel > campylobactor + Continue Azithromycin for 3 days.   Alcohol use: Patient is being counseled on abstinence daily. No evidence of withdrawal.   Hepatic steatosis: Incidental finding on CT. Possibly secondary to history of longstanding alcohol use.    Discharge Instructions  Discharge Instructions     Call MD for:  difficulty breathing, headache or visual disturbances   Complete by: As directed    Call MD for:   persistant dizziness or light-headedness   Complete by: As directed  Call MD for:  persistant nausea and vomiting   Complete by: As directed    Diet - low sodium heart healthy   Complete by: As directed    Diet Carb Modified   Complete by: As directed    Discharge instructions   Complete by: As directed    Advised to follow-up with primary care physician in 1 week. Advised to continue azithromycin 500 mg for 1 more day to complete 3-day treatment for Campylobacter infection. Advised to follow-up with endocrinology in 1 week. Advised to take Januvia and linagliptin as advised.   Increase activity slowly   Complete by: As directed       Allergies as of 09/27/2023   No Known Allergies      Medication List     STOP taking these medications    amoxicillin 250 MG capsule Commonly known as: AMOXIL   betamethasone dipropionate 0.05 % ointment Commonly known as: DIPROLENE   doxycycline 100 MG tablet Commonly known as: VIBRA-TABS   tacrolimus 0.1 % ointment Commonly known as: PROTOPIC       TAKE these medications    azithromycin 250 MG tablet Commonly known as: ZITHROMAX Advised to take azithromycin 500 mg for 1 more day to complete 3-day treatment for Campylobacter infection Start taking on: September 28, 2023   fenofibrate 160 MG tablet Take 1 tablet (160 mg total) by mouth daily. Start taking on: September 28, 2023   glipiZIDE 5 MG tablet Commonly known as: GLUCOTROL Take 1 tablet (5 mg total) by mouth daily before breakfast. Start taking on: September 28, 2023   insulin starter kit- pen needles Misc 1 kit by Other route once for 1 dose.   linagliptin 5 MG Tabs tablet Commonly known as: TRADJENTA Take 1 tablet (5 mg total) by mouth daily. Start taking on: September 28, 2023   magnesium hydroxide 400 MG/5ML suspension Commonly known as: MILK OF MAGNESIA Take 15 mLs by mouth daily as needed for mild constipation.   Oxycodone HCl 10 MG Tabs Take 1 tablet  (10 mg total) by mouth every 4 (four) hours as needed for up to 3 days for moderate pain (pain score 4-6).   pantoprazole 40 MG tablet Commonly known as: PROTONIX Take 1 tablet (40 mg total) by mouth daily. Start taking on: September 28, 2023   rosuvastatin 40 MG tablet Commonly known as: CRESTOR Take 1 tablet (40 mg total) by mouth daily. Start taking on: September 28, 2023        Follow-up Information     [facility]. Go on 10/14/2023.   Specialty: Internal Medicine Why: You have an appointment to establish primary care services on Friday, 10/14/23 at 1:00pm. Please arrive 15 minutes early to complete new patient paperwork. To request interpretation services, please call the office before your appointment (Tienes cita para establecer servicios de atencin primaria el viernes 04/20/23 a las 13:00 horas. Llegue 15 minutos antes para completar la documentacin de paciente nuevo. Para solicitar servicios de un intrprete, por favor llame a la oficina antes de su cita). Contact information: [address] [place] [address] [phone]        Altamese West Dennis, MD Follow up in 1 week(s).   Specialty: Endocrinology Contact information: [address] [place] [place] [address] [phone]         Darrow Bussing, MD Follow up in 1 week(s).   Specialty: Family Medicine Contact information: [address]  [place] [address] [phone]                No Known Allergies  Consultations: Gastroenterology.   Procedures/Studies: DG Chest 1 View  Result Date: 09/25/2023 CLINICAL DATA:  [id number] Pneumonia [id number] EXAM: CHEST  1 VIEW COMPARISON:  09/21/2023. FINDINGS: Cardiac silhouette is unremarkable. No pneumothorax or pleural effusion. The lungs are clear. The visualized skeletal structures are unremarkable. IMPRESSION: No acute cardiopulmonary process. Electronically Signed   By: Layla Maw M.D.   On: 09/25/2023 09:52   CT ABDOMEN PELVIS W CONTRAST  Result Date: 09/24/2023 CLINICAL DATA:  Acute pancreatitis. EXAM: CT ABDOMEN AND PELVIS WITH CONTRAST TECHNIQUE: Multidetector CT imaging of the abdomen and pelvis was performed using the standard protocol following bolus administration of intravenous contrast. RADIATION DOSE REDUCTION: This exam was performed according to the departmental dose-optimization program which includes automated exposure control, adjustment of the mA and/or kV according to patient size and/or use of iterative reconstruction technique. CONTRAST:  OMNIPAQUE IOHEXOL 300 MG/ML  SOLN COMPARISON:  September 20, 2023 FINDINGS: Lower chest: Bibasilar atelectasis. Hepatobiliary: Hepatic steatosis.  Normal gallbladder. Pancreas: Grossly abnormal appearance of the pancreas with diffuse pancreatic edema, peripancreatic fat stranding and peripancreatic free fluid. 4.9 by 2.6 by 2.4 cm pseudo cyst is located inferior to the distal pancreatic body. Spleen: Normal in size without focal abnormality. Adrenals/Urinary Tract: Adrenal glands are unremarkable. Kidneys are normal, without renal calculi, focal lesion, or hydronephrosis. Bladder is unremarkable. Stomach/Bowel: Stomach is within normal limits. Appendix appears normal. No evidence of bowel wall thickening, distention, or inflammatory changes. Vascular/Lymphatic: No significant vascular findings are present. No enlarged abdominal or pelvic lymph nodes. Reproductive: Prostate is unremarkable. Other: No abdominal wall hernia or abnormality. No abdominopelvic ascites. Musculoskeletal: No acute or significant osseous findings. IMPRESSION: 1. Acute pancreatitis.  No definite evidence of pancreatic necrosis. 2. 4.9 by 2.6 by 2.4 cm pseudo cyst inferior to the distal pancreatic body. 3. Hepatic steatosis. 4. Bibasilar atelectasis. Electronically Signed   By: Ted Mcalpine M.D.   On: 09/24/2023 15:56   US Abdomen Limited  RUQ (LIVER/GB)  Result Date: 09/22/2023 CLINICAL DATA:  Pancreatitis EXAM: ULTRASOUND ABDOMEN LIMITED RIGHT UPPER QUADRANT COMPARISON:  CT 09/20/2023 FINDINGS: Gallbladder: No shadowing stones. Diffuse gallbladder wall thickening up to 10 mm with gallbladder wall edema. Negative sonographic Murphy. Common bile duct: Diameter: 2.7 mm Liver: Echogenic liver parenchyma. No focal hepatic abnormality. Portal vein is patent on color Doppler imaging with normal direction of blood flow towards the liver. Other: Trace perihepatic ascites IMPRESSION: 1. Negative for gallstones. Diffuse gallbladder wall thickening and edema is nonspecific and could be secondary to underlying liver disease or edematous state. If concern for acute gallbladder disease, correlation with nuclear medicine hepatobiliary imaging could be obtained. 2. Echogenic liver parenchyma consistent with hepatic steatosis. Trace perihepatic ascites. Electronically Signed   By: Jasmine Pang M.D.   On: 09/22/2023 22:14   DG Chest 1 View  Result Date: 09/21/2023 CLINICAL DATA:  Pneumonia. EXAM: CHEST  1 VIEW COMPARISON:  October 15, 2022. FINDINGS: The heart size and mediastinal contours are within normal limits. Hypoinflation of the lungs is noted with minimal bibasilar subsegmental atelectasis. The visualized skeletal structures are unremarkable. IMPRESSION: Hypoinflation of the lungs with minimal bibasilar subsegmental atelectasis. Electronically Signed   By: Lupita Raider M.D.   On: 09/21/2023 20:00   CT ABDOMEN PELVIS W CONTRAST  Result Date: 09/20/2023 CLINICAL DATA:  Epigastric pain and emesis EXAM: CT ABDOMEN AND PELVIS WITH  CONTRAST TECHNIQUE: Multidetector CT imaging of the abdomen and pelvis was performed using the standard protocol following bolus administration of intravenous contrast. RADIATION DOSE REDUCTION: This exam was performed according to the departmental dose-optimization program which includes automated exposure control,  adjustment of the mA and/or kV according to patient size and/or use of iterative reconstruction technique. CONTRAST:  OMNIPAQUE IOHEXOL 300 MG/ML  SOLN COMPARISON:  CT chest abdomen and pelvis 10/15/2022 FINDINGS: Lower chest: Bibasilar atelectasis/scarring.  No acute abnormality. Hepatobiliary: Marked hepatic steatosis. Gallbladder and biliary tree are unremarkable. Pancreas: Pancreatic edema with adjacent inflammatory stranding and free fluid. No evidence of pancreatic necrosis. 1.8 x 0.8 cm fluid collection in the uncinate process. Spleen: Unremarkable. Adrenals/Urinary Tract: Normal adrenal glands. Nonobstructing left nephrolithiasis. No hydronephrosis. Unremarkable bladder. Stomach/Bowel: No bowel obstruction or bowel wall thickening. Appendix and stomach are within normal limits. Wall thickening and mucosal hyperenhancement in the duodenum likely reactive secondary to pancreatitis. Vascular/Lymphatic: No significant vascular findings are present. No enlarged abdominal or pelvic lymph nodes. Reproductive: Unremarkable. Other: No free intraperitoneal air. Musculoskeletal: No acute fracture. IMPRESSION: 1. Acute uncomplicated pancreatitis. 2. 1.8 x 0.8 cm fluid collection in the uncinate process, likely a pseudocyst. 3. Marked hepatic steatosis. 4. Nonobstructing left nephrolithiasis. Electronically Signed   By: Minerva Fester M.D.   On: 09/20/2023 22:12     Subjective: Patient was seen and examined at bedside.  Overnight events noted.   Patient reports doing much better and wants to be discharged. Tolerated soft bland diet.  Abdominal pain has improved.   Discharge Exam: Vitals:   09/26/23 1932 09/27/23 0418  BP: (!) 126/90 (!) 112/94  Pulse: 90 67  Resp: 18 17  Temp: 98 F (36.7 C) 98.2 F (36.8 C)  SpO2: 95% 100%   Vitals:   09/26/23 0444 09/26/23 1517 09/26/23 1932 09/27/23 0418  BP: 108/76 111/78 (!) 126/90 (!) 112/94  Pulse: 82 72 90 67  Resp: 16 18 18 17   Temp: 98.6 F (37  C) 99.1 F (37.3 C) 98 F (36.7 C) 98.2 F (36.8 C)  TempSrc:  Oral Oral   SpO2: 95% 98% 95% 100%  Weight:      Height:        General: Pt is alert, awake, not in acute distress Cardiovascular: RRR, S1/S2 +, no rubs, no gallops Respiratory: CTA bilaterally, no wheezing, no rhonchi Abdominal: Soft, NT, ND, bowel sounds + Extremities: no edema, no cyanosis    The results of significant diagnostics from this hospitalization (including imaging, microbiology, ancillary and laboratory) are listed below for reference.     Microbiology: Recent Results (from the past 240 hour(s))  SARS Coronavirus 2 by RT PCR (hospital order, performed in [facility] hospital lab) *cepheid single result test* Anterior Nasal Swab     Status: None   Collection Time: 09/22/23 11:22 AM   Specimen: Anterior Nasal Swab  Result Value Ref Range Status   SARS Coronavirus 2 by RT PCR NEGATIVE NEGATIVE Final    Comment: (NOTE) SARS-CoV-2 target nucleic acids are NOT DETECTED.  The SARS-CoV-2 RNA is generally detectable in upper and lower respiratory specimens during the acute phase of infection. The lowest concentration of SARS-CoV-2 viral copies this assay can detect is 250 copies / mL. A negative result does not preclude SARS-CoV-2 infection and should not be used as the sole basis for treatment or other patient management decisions.  A negative result may occur with improper specimen collection / handling, submission of specimen other than nasopharyngeal swab, presence  of viral mutation(s) within the areas targeted by this assay, and inadequate number of viral copies (<250 copies / mL). A negative result must be combined with clinical observations, patient history, and epidemiological information.  Fact Sheet for Patients:   RoadLapTop.co.za  Fact Sheet for Healthcare Providers: http://kim-miller.com/  This test is not yet approved or  cleared by the [place] FDA and has been authorized for detection and/or diagnosis of SARS-CoV-2 by FDA under an Emergency Use Authorization (EUA).  This EUA will remain in effect (meaning this test can be used) for the duration of the COVID-19 declaration under Section 564(b)(1) of the Act, 21 U.S.C. section 360bbb-3(b)(1), unless the authorization is terminated or revoked sooner.  Performed at [facility], [address], [place], [place] [address]   Gastrointestinal Panel by PCR , Stool     Status: Abnormal   Collection Time: 09/23/23  1:32 PM   Specimen: Stool  Result Value Ref Range Status   Campylobacter species DETECTED (A) NOT DETECTED Final    Comment: CRITICAL RESULT CALLED TO, READ BACK BY AND VERIFIED WITH: Crawford Givens RN [facility] @2334  09/25/23 LFD    Plesimonas shigelloides NOT DETECTED NOT DETECTED Final   Salmonella species NOT DETECTED NOT DETECTED Final   Yersinia enterocolitica NOT DETECTED NOT DETECTED Final   Vibrio species NOT DETECTED NOT DETECTED Final   Vibrio cholerae NOT DETECTED NOT DETECTED Final   Enteroaggregative E coli (EAEC) NOT DETECTED NOT DETECTED Final   Enteropathogenic E coli (EPEC) NOT DETECTED NOT DETECTED Final   Enterotoxigenic E coli (ETEC) NOT DETECTED NOT DETECTED Final   Shiga like toxin producing E coli (STEC) NOT DETECTED NOT DETECTED Final   Shigella/Enteroinvasive E coli (EIEC) NOT DETECTED NOT DETECTED Final   Cryptosporidium NOT DETECTED NOT DETECTED Final   Cyclospora cayetanensis NOT DETECTED NOT DETECTED Final   Entamoeba histolytica NOT DETECTED NOT DETECTED Final   Giardia lamblia NOT DETECTED NOT DETECTED Final   Adenovirus F40/41 NOT DETECTED NOT DETECTED Final   Astrovirus NOT DETECTED NOT DETECTED Final   Norovirus GI/GII NOT DETECTED NOT DETECTED Final   Rotavirus A NOT DETECTED NOT DETECTED Final   Sapovirus (I, II, IV, and V) NOT DETECTED NOT DETECTED Final    Comment: Performed at [facility], [address] [place], [place] [address]     Labs: BNP (last 3 results) No results for input(s): "BNP" in the last 8760 hours. Basic Metabolic Panel: Recent Labs  Lab 09/22/23 0819 09/23/23 0520 09/24/23 0709 09/25/23 0603 09/27/23 0518  NA 130* 131* 130* 133* 133*  K 3.3* 3.4* 3.5 3.7 3.9  CL 102 102 98 100 98  CO2 21* 21* 22 20* 24  GLUCOSE 142* 145* 146* 154* 211*  BUN 8 6 6  <5* 7  CREATININE 0.70 0.38* 0.69 0.70 0.78  CALCIUM 7.9* 8.1* 8.3* 8.7* 9.1  MG 2.2 2.2 2.2 2.2 2.5*  PHOS  --   --  2.6  --  3.2   Liver Function Tests: Recent Labs  Lab 09/21/23 0512 09/22/23 0819 09/23/23 0520 09/24/23 0709 09/25/23 0603  AST 58* 15 15 16 20   ALT 28 15 15 17 20   ALKPHOS 81 68 74 88 99  BILITOT 2.4* 1.2* 1.4* 1.5* 1.4*  PROT RESULTS UNAVAILABLE DUE TO INTERFERING SUBSTANCE 6.2* 6.8 6.8 7.5  ALBUMIN 3.3* 2.8* 2.9* 3.0* 3.1*   Recent Labs  Lab 09/21/23 0512 09/23/23 0520 09/25/23 0603  LIPASE 380* 74* 66*   No results  for input(s): "AMMONIA" in the last 168 hours. CBC: Recent Labs  Lab 09/22/23 0819 09/23/23 0520 09/24/23 0709 09/25/23 0603 09/27/23 0518  WBC 11.2* 10.1 9.4 10.5 7.6  NEUTROABS 8.0* 7.1 6.6 7.8*  --   HGB 11.8* 11.5* 12.3* 12.5* 13.4  HCT 34.7* 33.9* 36.2* 37.5* 40.5  MCV 90.4 89.9 90.0 89.5 90.6  PLT 144* 158 194 223 315   Cardiac Enzymes: No results for input(s): "CKTOTAL", "CKMB", "CKMBINDEX", "TROPONINI" in the last 168 hours. BNP: Invalid input(s): "POCBNP" CBG: Recent Labs  Lab 09/26/23 1138 09/26/23 1645 09/26/23 2210 09/27/23 0756 09/27/23 1153  GLUCAP 217* 207* 232* 183* 129*   D-Dimer No results for input(s): "DDIMER" in the last 72 hours. Hgb A1c No results for input(s): "HGBA1C" in the last 72 hours. Lipid Profile No results for input(s): "CHOL", "HDL", "LDLCALC", "TRIG", "CHOLHDL", "LDLDIRECT" in the last 72 hours. Thyroid function studies No results for input(s): "TSH", "T4TOTAL", "T3FREE", "THYROIDAB" in the last  72 hours.  Invalid input(s): "FREET3" Anemia work up No results for input(s): "VITAMINB12", "FOLATE", "FERRITIN", "TIBC", "IRON", "RETICCTPCT" in the last 72 hours. Urinalysis    Component Value Date/Time   COLORURINE YELLOW 09/20/2023 1548   APPEARANCEUR CLEAR 09/20/2023 1548   LABSPEC 1.042 (H) 09/20/2023 1548   PHURINE 5.0 09/20/2023 1548   GLUCOSEU >=500 (A) 09/20/2023 1548   HGBUR NEGATIVE 09/20/2023 1548   BILIRUBINUR NEGATIVE 09/20/2023 1548   KETONESUR 80 (A) 09/20/2023 1548   PROTEINUR 100 (A) 09/20/2023 1548   NITRITE NEGATIVE 09/20/2023 1548   LEUKOCYTESUR NEGATIVE 09/20/2023 1548   Sepsis Labs Recent Labs  Lab 09/23/23 0520 09/24/23 0709 09/25/23 0603 09/27/23 0518  WBC 10.1 9.4 10.5 7.6   Microbiology Recent Results (from the past 240 hour(s))  SARS Coronavirus 2 by RT PCR (hospital order, performed in [facility] hospital lab) *cepheid single result test* Anterior Nasal Swab     Status: None   Collection Time: 09/22/23 11:22 AM   Specimen: Anterior Nasal Swab  Result Value Ref Range Status   SARS Coronavirus 2 by RT PCR NEGATIVE NEGATIVE Final    Comment: (NOTE) SARS-CoV-2 target nucleic acids are NOT DETECTED.  The SARS-CoV-2 RNA is generally detectable in upper and lower respiratory specimens during the acute phase of infection. The lowest concentration of SARS-CoV-2 viral copies this assay can detect is 250 copies / mL. A negative result does not preclude SARS-CoV-2 infection and should not be used as the sole basis for treatment or other patient management decisions.  A negative result may occur with improper specimen collection / handling, submission of specimen other than nasopharyngeal swab, presence of viral mutation(s) within the areas targeted by this assay, and inadequate number of viral copies (<250 copies / mL). A negative result must be combined with clinical observations, patient history, and epidemiological information.  Fact Sheet for  Patients:   RoadLapTop.co.za  Fact Sheet for Healthcare Providers: http://kim-miller.com/  This test is not yet approved or  cleared by the [place] FDA and has been authorized for detection and/or diagnosis of SARS-CoV-2 by FDA under an Emergency Use Authorization (EUA).  This EUA will remain in effect (meaning this test can be used) for the duration of the COVID-19 declaration under Section 564(b)(1) of the Act, 21 U.S.C. section 360bbb-3(b)(1), unless the authorization is terminated or revoked sooner.  Performed at [facility], [address], [place], [place] [address]   Gastrointestinal Panel by PCR , Stool     Status: Abnormal  Collection Time: 09/23/23  1:32 PM   Specimen: Stool  Result Value Ref Range Status   Campylobacter species DETECTED (A) NOT DETECTED Final    Comment: CRITICAL RESULT CALLED TO, READ BACK BY AND VERIFIED WITH: Crawford Givens RN [facility] @2334  09/25/23 LFD    Plesimonas shigelloides NOT DETECTED NOT DETECTED Final   Salmonella species NOT DETECTED NOT DETECTED Final   Yersinia enterocolitica NOT DETECTED NOT DETECTED Final   Vibrio species NOT DETECTED NOT DETECTED Final   Vibrio cholerae NOT DETECTED NOT DETECTED Final   Enteroaggregative E coli (EAEC) NOT DETECTED NOT DETECTED Final   Enteropathogenic E coli (EPEC) NOT DETECTED NOT DETECTED Final   Enterotoxigenic E coli (ETEC) NOT DETECTED NOT DETECTED Final   Shiga like toxin producing E coli (STEC) NOT DETECTED NOT DETECTED Final   Shigella/Enteroinvasive E coli (EIEC) NOT DETECTED NOT DETECTED Final   Cryptosporidium NOT DETECTED NOT DETECTED Final   Cyclospora cayetanensis NOT DETECTED NOT DETECTED Final   Entamoeba histolytica NOT DETECTED NOT DETECTED Final   Giardia lamblia NOT DETECTED NOT DETECTED Final   Adenovirus F40/41 NOT DETECTED NOT DETECTED Final   Astrovirus NOT DETECTED NOT DETECTED Final   Norovirus GI/GII NOT  DETECTED NOT DETECTED Final   Rotavirus A NOT DETECTED NOT DETECTED Final   Sapovirus (I, II, IV, and V) NOT DETECTED NOT DETECTED Final    Comment: Performed at [facility], [address], [place], [place] [address]     Time coordinating discharge: Over 30 minutes  SIGNED:   Willeen Niece, MD  [facility] 09/27/2023, 4:06 PM Pager   If 7PM-7AM, please contact night-coverage

## 2023-09-27 NOTE — Plan of Care (Addendum)
No acute events overnight. Pt's wife states that this pain has been ongoing for awhile when pt eats, not just with alcohol. She states he stopped drinking much alcohol about a year ago, but still has pain after eating.   Problem: Education: Goal: Knowledge of General Education information will improve Description: Including pain rating scale, medication(s)/side effects and non-pharmacologic comfort measures Outcome: Progressing   Problem: Health Behavior/Discharge Planning: Goal: Ability to manage health-related needs will improve Outcome: Progressing   Problem: Clinical Measurements: Goal: Ability to maintain clinical measurements within normal limits will improve Outcome: Progressing Goal: Will remain free from infection Outcome: Progressing Goal: Diagnostic test results will improve Outcome: Progressing Goal: Respiratory complications will improve Outcome: Progressing Goal: Cardiovascular complication will be avoided Outcome: Progressing   Problem: Activity: Goal: Risk for activity intolerance will decrease Outcome: Progressing   Problem: Nutrition: Goal: Adequate nutrition will be maintained Outcome: Progressing   Problem: Coping: Goal: Level of anxiety will decrease Outcome: Progressing   Problem: Elimination: Goal: Will not experience complications related to bowel motility Outcome: Progressing Goal: Will not experience complications related to urinary retention Outcome: Progressing   Problem: Pain Management: Goal: General experience of comfort will improve Outcome: Progressing   Problem: Safety: Goal: Ability to remain free from injury will improve Outcome: Progressing   Problem: Skin Integrity: Goal: Risk for impaired skin integrity will decrease Outcome: Progressing   Problem: Education: Goal: Ability to describe self-care measures that may prevent or decrease complications (Diabetes Survival Skills Education) will improve Outcome: Progressing Goal:  Individualized Educational Video(s) Outcome: Progressing   Problem: Coping: Goal: Ability to adjust to condition or change in health will improve Outcome: Progressing   Problem: Fluid Volume: Goal: Ability to maintain a balanced intake and output will improve Outcome: Progressing   Problem: Health Behavior/Discharge Planning: Goal: Ability to identify and utilize available resources and services will improve Outcome: Progressing Goal: Ability to manage health-related needs will improve Outcome: Progressing   Problem: Metabolic: Goal: Ability to maintain appropriate glucose levels will improve Outcome: Progressing   Problem: Nutritional: Goal: Maintenance of adequate nutrition will improve Outcome: Progressing Goal: Progress toward achieving an optimal weight will improve Outcome: Progressing   Problem: Skin Integrity: Goal: Risk for impaired skin integrity will decrease Outcome: Progressing   Problem: Tissue Perfusion: Goal: Adequacy of tissue perfusion will improve Outcome: Progressing

## 2023-09-27 NOTE — Inpatient Diabetes Management (Signed)
Inpatient Diabetes Program Recommendations  AACE/ADA: New Consensus Statement on Inpatient Glycemic Control (2015)  Target Ranges:  Prepandial:   less than 140 mg/dL      Peak postprandial:   less than 180 mg/dL (1-2 hours)      Critically ill patients:  140 - 180 mg/dL   Lab Results  Component Value Date   GLUCAP 129 (H) 09/27/2023   HGBA1C 11.5 (H) 09/21/2023    Review of Glycemic Control  Diabetes history: New DM diagnosis Outpatient Diabetes medications: None Current orders for Inpatient glycemic control: Novolog 0-9 units TID with meals and 0-5 HS, glipizide 5 mg before breakfast, Tradjenta 5 mg daily  HgbA1C - 11.5% Total Novolog on 11/18: 11 units Total Novolog on 11/19: 3 units  Inpatient Diabetes Program Recommendations:    Reviewed insulin pen administration.  Recommend both glipizide and tradjenta for home. If sliding scale is ordered, recommend Novolin R 0-9 TID ac and hs  Discussed glucose and A1C goals. Discussed importance of checking CBGs and maintaining good CBG control to prevent long-term and short-term complications. Explained how hyperglycemia leads to damage within blood vessels which lead to the common complications seen with uncontrolled diabetes. Stressed to the patient the importance of improving glycemic control to prevent further complications from uncontrolled diabetes. Discussed impact of nutrition, exercise, stress, sickness, and medications on diabetes control.  Discussed carbohydrates, carbohydrate goals. Pt states his wife is looking for PCP to manage his diabetes. Instructed pt to monitor blood sugars at least 3x/day and take logbook to PCP for review. Reviewed hypoglycemia s/s and treatment. Answered all questions. Pt seems very motivated to control his diabetes.  Thank you. Ailene Ards, RD, LDN, [facility] Inpatient Diabetes Coordinator [phone]

## 2023-09-27 NOTE — Discharge Instructions (Signed)
Advised to follow-up with primary care physician in 1 week. Advised to continue azithromycin 500 mg for 1 more day to complete 3-day treatment for Campylobacter infection. Advised to follow-up with endocrinology in 1 week. Advised to take Januvia and linagliptin as advised.

## 2023-10-17 LAB — MISCELLANEOUS TEST
